# Patient Record
Sex: Male | Born: 1965 | Race: Black or African American | Hispanic: No | Marital: Married | Smoking: Former smoker
Health system: Southern US, Community
[De-identification: ages and names within clinical notes are randomized; demographics above are authoritative.]

## PROBLEM LIST (undated history)

## (undated) DIAGNOSIS — M1990 Unspecified osteoarthritis, unspecified site: Secondary | ICD-10-CM

## (undated) DIAGNOSIS — R519 Headache, unspecified: Secondary | ICD-10-CM

## (undated) DIAGNOSIS — M2550 Pain in unspecified joint: Secondary | ICD-10-CM

## (undated) DIAGNOSIS — I10 Essential (primary) hypertension: Secondary | ICD-10-CM

## (undated) DIAGNOSIS — R51 Headache: Secondary | ICD-10-CM

## (undated) DIAGNOSIS — Z8619 Personal history of other infectious and parasitic diseases: Secondary | ICD-10-CM

## (undated) HISTORY — PX: ANKLE SURGERY: SHX546

---

## 2001-05-05 ENCOUNTER — Encounter: Payer: Self-pay | Admitting: Family Medicine

## 2001-05-05 ENCOUNTER — Encounter: Admission: RE | Admit: 2001-05-05 | Discharge: 2001-05-05 | Payer: Self-pay | Admitting: Family Medicine

## 2002-12-31 ENCOUNTER — Encounter: Admission: RE | Admit: 2002-12-31 | Discharge: 2002-12-31 | Payer: Self-pay | Admitting: Family Medicine

## 2002-12-31 ENCOUNTER — Encounter: Payer: Self-pay | Admitting: Family Medicine

## 2004-12-27 ENCOUNTER — Ambulatory Visit (HOSPITAL_COMMUNITY): Admission: RE | Admit: 2004-12-27 | Discharge: 2004-12-27 | Payer: Self-pay | Admitting: Orthopedic Surgery

## 2006-05-02 ENCOUNTER — Encounter: Admission: RE | Admit: 2006-05-02 | Discharge: 2006-05-02 | Payer: Self-pay | Admitting: Family Medicine

## 2009-06-16 ENCOUNTER — Observation Stay (HOSPITAL_COMMUNITY): Admission: EM | Admit: 2009-06-16 | Discharge: 2009-06-16 | Payer: Self-pay | Admitting: Emergency Medicine

## 2009-07-10 DIAGNOSIS — R079 Chest pain, unspecified: Secondary | ICD-10-CM | POA: Insufficient documentation

## 2010-09-30 ENCOUNTER — Emergency Department (HOSPITAL_COMMUNITY)
Admission: EM | Admit: 2010-09-30 | Discharge: 2010-09-30 | Payer: Self-pay | Source: Home / Self Care | Admitting: Emergency Medicine

## 2010-10-02 ENCOUNTER — Encounter: Admission: RE | Admit: 2010-10-02 | Discharge: 2010-10-02 | Payer: Self-pay | Source: Home / Self Care

## 2011-01-04 LAB — DIFFERENTIAL
Basophils Absolute: 0 10*3/uL (ref 0.0–0.1)
Eosinophils Relative: 1 % (ref 0–5)
Lymphocytes Relative: 5 % — ABNORMAL LOW (ref 12–46)
Lymphs Abs: 0.4 10*3/uL — ABNORMAL LOW (ref 0.7–4.0)
Monocytes Absolute: 0.8 10*3/uL (ref 0.1–1.0)
Neutro Abs: 7.8 10*3/uL — ABNORMAL HIGH (ref 1.7–7.7)

## 2011-01-04 LAB — POCT CARDIAC MARKERS
CKMB, poc: 1.4 ng/mL (ref 1.0–8.0)
CKMB, poc: <1 ng/mL — ABNORMAL LOW (ref 1.0–8.0)
Troponin i, poc: <0.05 ng/mL (ref 0.00–0.09)

## 2011-01-04 LAB — BASIC METABOLIC PANEL
Calcium: 9.2 mg/dL (ref 8.4–10.5)
GFR calc Af Amer: >60 mL/min (ref >60)
GFR calc non Af Amer: >60 mL/min (ref >60)
Glucose, Bld: 99 mg/dL (ref 70–99)
Potassium: 4 mEq/L (ref 3.5–5.1)
Sodium: 139 mEq/L (ref 135–145)

## 2011-01-04 LAB — CK TOTAL AND CKMB (NOT AT ARMC)
CK, MB: 2.1 ng/mL (ref 0.3–4.0)
Relative Index: 1.1 (ref 0.0–2.5)
Relative Index: 1.2 (ref 0.0–2.5)

## 2011-01-04 LAB — TROPONIN I
Troponin I: 0.02 ng/mL (ref 0.00–0.06)
Troponin I: 0.03 ng/mL (ref 0.00–0.06)

## 2011-01-04 LAB — CBC
HCT: 40.8 % (ref 39.0–52.0)
Hemoglobin: 14.1 g/dL (ref 13.0–17.0)
RBC: 4.68 MIL/uL (ref 4.22–5.81)
RDW: 12.8 % (ref 11.5–15.5)
WBC: 9.1 10*3/uL (ref 4.0–10.5)

## 2011-01-04 LAB — HEPATIC FUNCTION PANEL
ALT: 23 U/L (ref 0–53)
Albumin: 3.5 g/dL (ref 3.5–5.2)
Alkaline Phosphatase: 66 U/L (ref 39–117)
Total Bilirubin: 0.7 mg/dL (ref 0.3–1.2)
Total Protein: 6.3 g/dL (ref 6.0–8.3)

## 2011-01-04 LAB — CARDIAC PANEL(CRET KIN+CKTOT+MB+TROPI): Total CK: 172 U/L (ref 7–232)

## 2012-02-15 IMAGING — CT CT MAXILLOFACIAL W/O CM
3 of 4 series · 16 of 37 positions shown, 18 images · non-contrast
Comparison: Cervical radiographs 05/02/2006.

CLINICAL DATA: 44-year-old male with right posterior mandible
lesion, cheek swelling.

CT MAXILLOFACIAL WITHOUT CONTRAST
TECHNIQUE: Multidetector CT imaging of the maxillofacial
structures was performed. Multiplanar CT image reconstructions were
also generated.

[Series 4: max bone · axial · 0.33mm/px · z∈[-85,+35]mm · 7 of 66 slices shown, 9 images]
[im 9/66  brain]
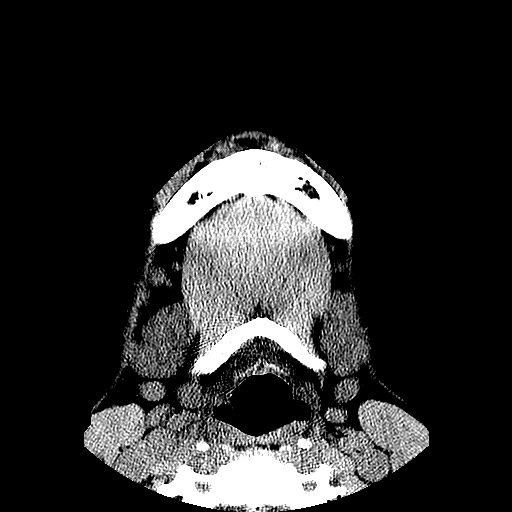
[im 9/66  bone]
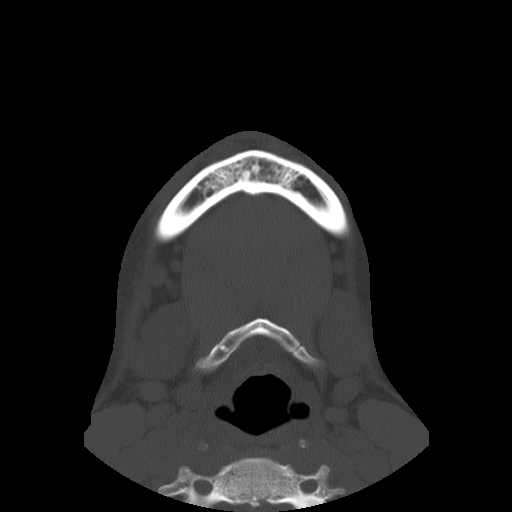
[im 17/66  bone]
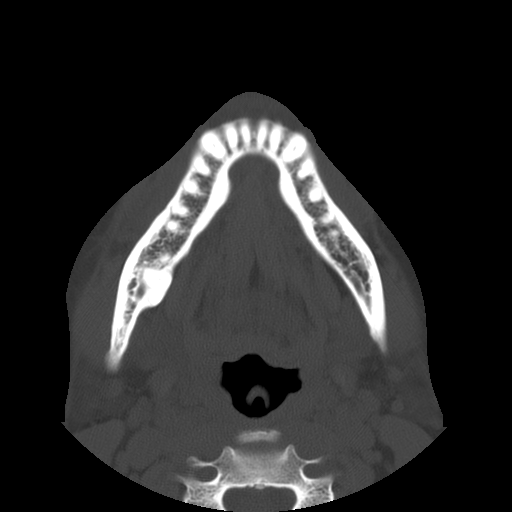
[im 25/66  bone]
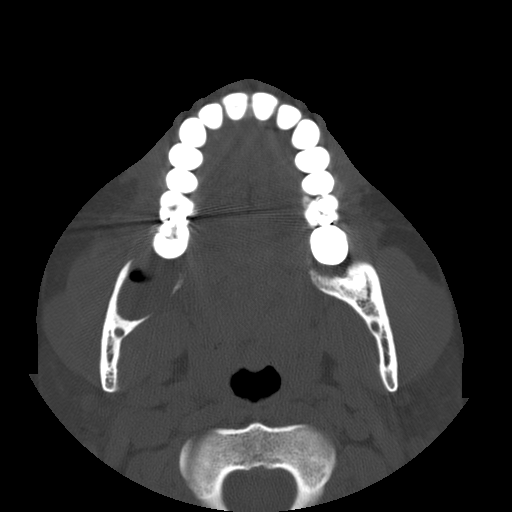
[im 33/66  bone]
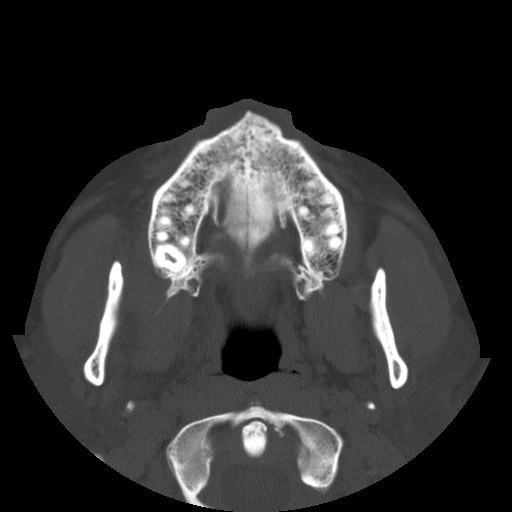
[im 41/66  brain]
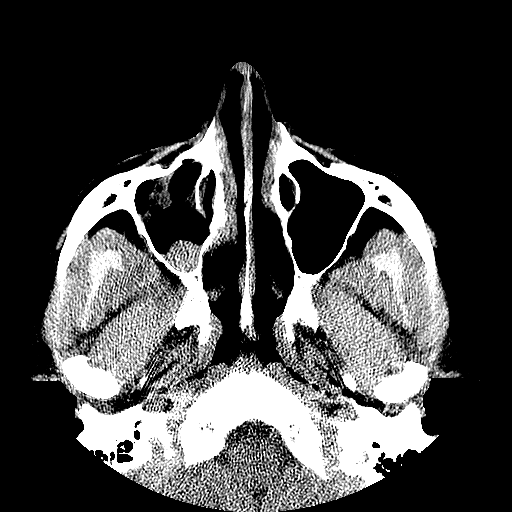
[im 41/66  bone]
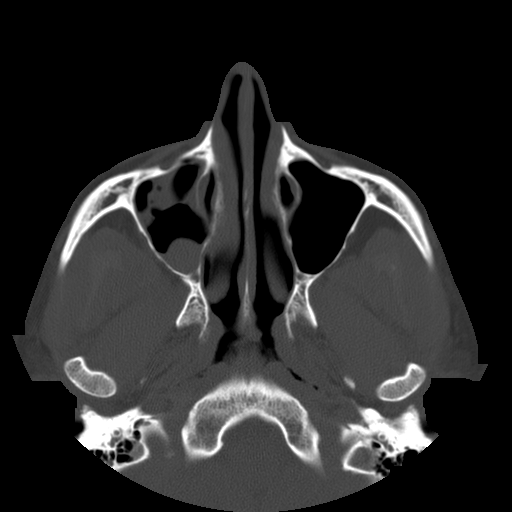
[im 49/66  bone]
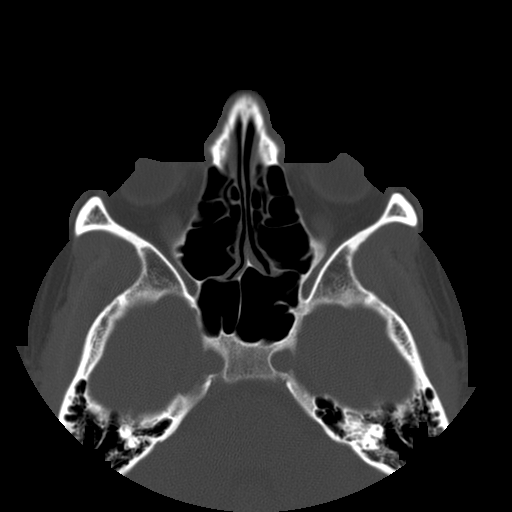
[im 57/66  bone]
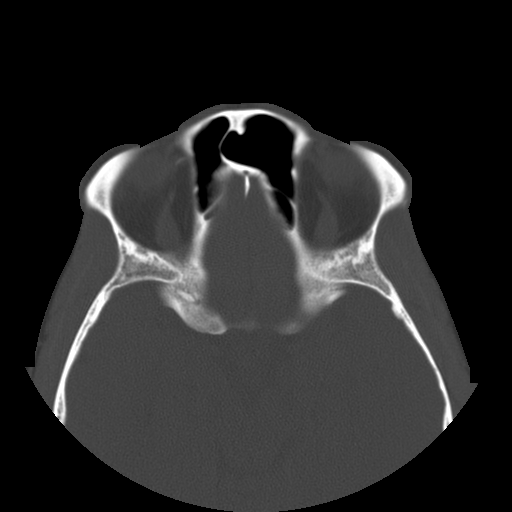

[Series 601: coronal facial · coronal · 0.33mm/px · 3 of 81 slices shown]
[im 27/81  bone]
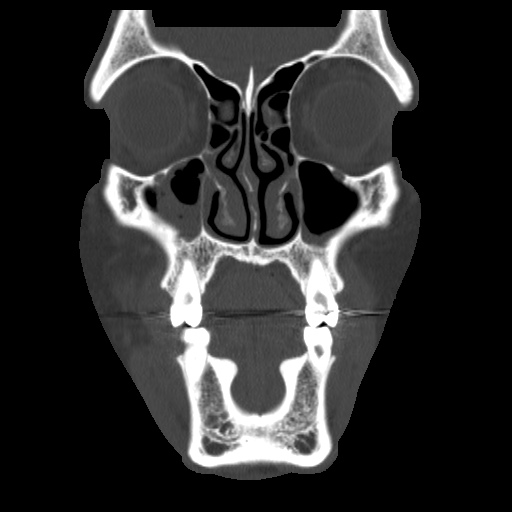
[im 41/81  bone]
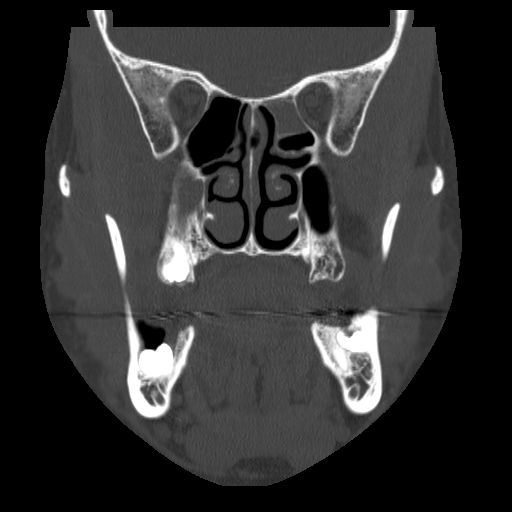
[im 54/81  bone]
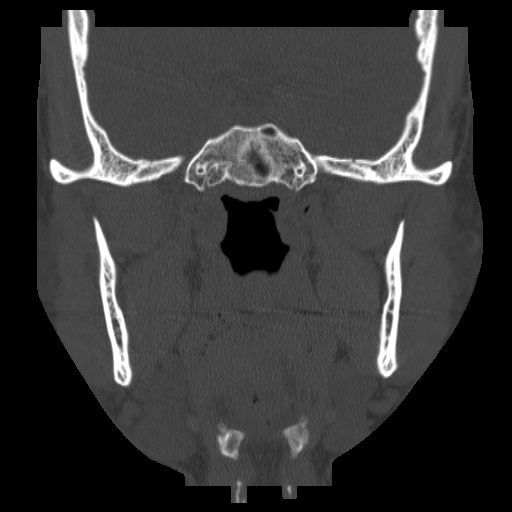

[Series 602: sagittal facial · sagittal · 0.33mm/px · 6 of 83 slices shown]
[im 8/83  bone]
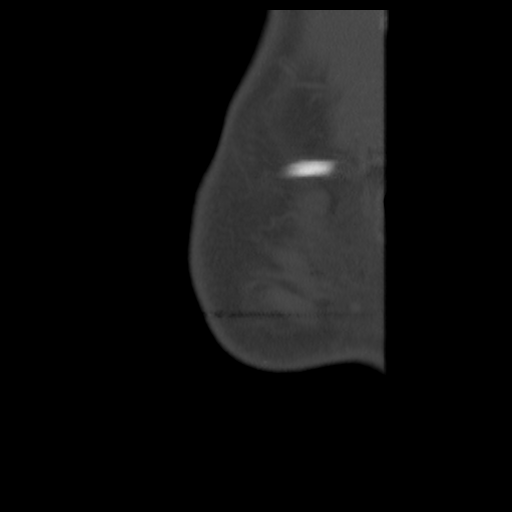
[im 15/83  bone]
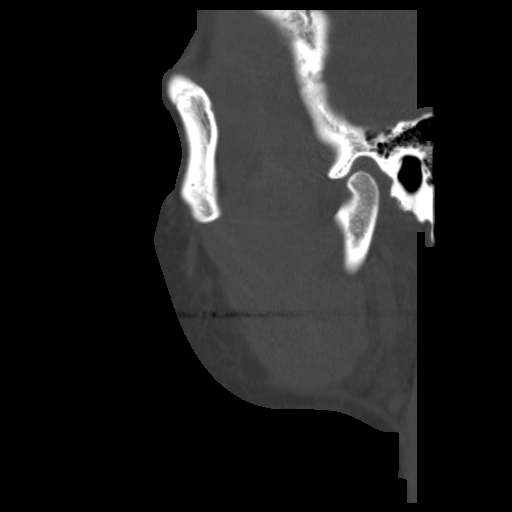
[im 30/83  bone]
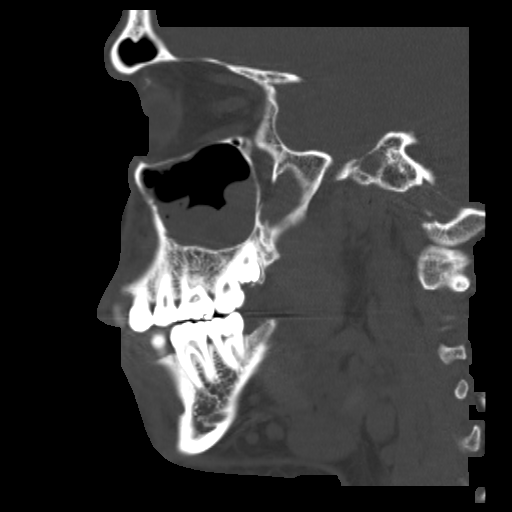
[im 38/83  bone]
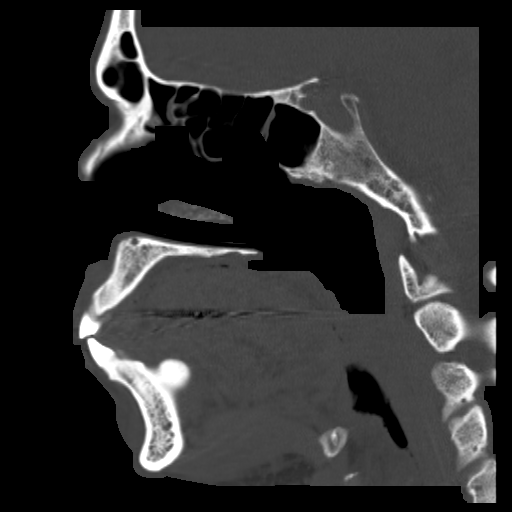
[im 45/83  bone]
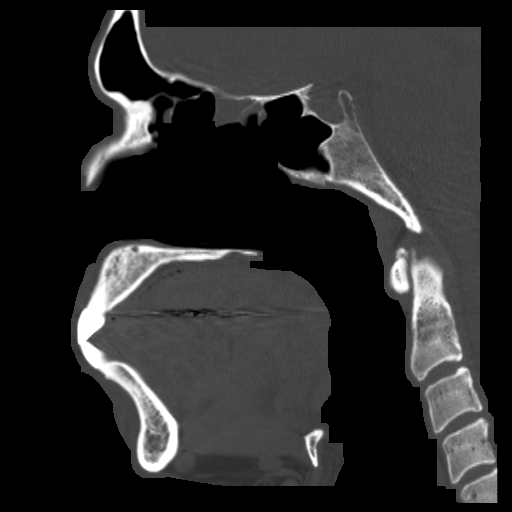
[im 53/83  bone]
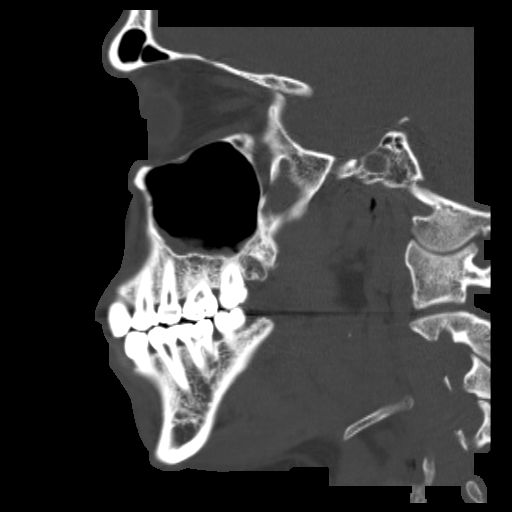

[16 of 37 positions shown; findings below may reference images not displayed]

FINDINGS: There is a large lucent lesion of the posterior right
mandible associated with the crown of a an unerupted wisdom tooth.
In retrospect, this is present in 7223, but today, contains an air-
fluid level which may be new.

The mandible elsewhere is within normal limits.  Small mandibular
torus are present bilaterally.  Dentition elsewhere within normal
limits; unerupted left mandibular and right maxillary wisdom teeth
also noted.

Mild inflammatory stranding in the subcutaneous fat about the right
angle of the mandible.  Sublingual space unaffected.
Parapharyngeal and retropharyngeal spaces appear unaffected.
Pharyngeal mucosal spaces within normal limits.  Submandibular and
visualized parotid glands within normal limits.  Right level I be
lymph nodes appear mildly increased in size and number, the largest
is an 11 mm short axis node on axial image 56.  Level II and other
visualized cervical lymph nodes appear more symmetric.

Pharyngeal mucosal spaces and visualized larynx are within normal
limits.

Mucosal thickening in the right maxillary sinus plus overlying
bubbly opacity.  Scattered ethmoid opacification, more so on the
left.  Sphenoid, frontal, and visualized mastoid air cells are
clear.

Visualized orbit soft tissues are within normal limits.  Visualized
noncontrast brain parenchyma within normal limits.
IMPRESSION: 1.  Constellation of findings suggestive of an infected preexisting
right mandibular odontogenic (favor dentigerous) cyst with
surrounding right perimandibular fat stranding and mild reactive
lymphadenopathy (superficial cellulitis).
2.  Right maxillary and ethmoid sinus inflammatory changes could
reflect acute sinusitis in the appropriate clinical setting.

I discussed the above findings with Dr. Jonnathan by telephone at [phone]
hours on 10/02/2010.

## 2016-06-07 ENCOUNTER — Other Ambulatory Visit: Payer: Self-pay | Admitting: Orthopedic Surgery

## 2016-06-21 ENCOUNTER — Encounter (HOSPITAL_COMMUNITY)
Admission: RE | Admit: 2016-06-21 | Discharge: 2016-06-21 | Disposition: A | Discharge status: Home / Self Care | Payer: BC Managed Care – PPO | Source: Ambulatory Visit | Attending: Orthopedic Surgery | Admitting: Orthopedic Surgery

## 2016-06-21 ENCOUNTER — Encounter (HOSPITAL_COMMUNITY): Payer: Self-pay

## 2016-06-21 DIAGNOSIS — M1611 Unilateral primary osteoarthritis, right hip: Secondary | ICD-10-CM | POA: Diagnosis not present

## 2016-06-21 DIAGNOSIS — Z01810 Encounter for preprocedural cardiovascular examination: Secondary | ICD-10-CM | POA: Diagnosis not present

## 2016-06-21 DIAGNOSIS — Z01812 Encounter for preprocedural laboratory examination: Secondary | ICD-10-CM | POA: Insufficient documentation

## 2016-06-21 HISTORY — DX: Personal history of other infectious and parasitic diseases: Z86.19

## 2016-06-21 HISTORY — DX: Headache: R51

## 2016-06-21 HISTORY — DX: Essential (primary) hypertension: I10

## 2016-06-21 HISTORY — DX: Pain in unspecified joint: M25.50

## 2016-06-21 HISTORY — DX: Headache, unspecified: R51.9

## 2016-06-21 HISTORY — DX: Unspecified osteoarthritis, unspecified site: M19.90

## 2016-06-21 LAB — CBC WITH DIFFERENTIAL/PLATELET
Basophils Absolute: 0 10*3/uL (ref 0.0–0.1)
Basophils Relative: 0 %
EOS ABS: 0.1 10*3/uL (ref 0.0–0.7)
Eosinophils Relative: 2 %
HEMATOCRIT: 42.5 % (ref 39.0–52.0)
HEMOGLOBIN: 14.8 g/dL (ref 13.0–17.0)
LYMPHS ABS: 1.5 10*3/uL (ref 0.7–4.0)
Lymphocytes Relative: 33 %
MCH: 29.2 pg (ref 26.0–34.0)
MCHC: 34.8 g/dL (ref 30.0–36.0)
MCV: 83.8 fL (ref 78.0–100.0)
MONOS PCT: 7 %
Monocytes Absolute: 0.3 10*3/uL (ref 0.1–1.0)
NEUTROS ABS: 2.7 10*3/uL (ref 1.7–7.7)
NEUTROS PCT: 58 %
Platelets: 256 10*3/uL (ref 150–400)
RBC: 5.07 MIL/uL (ref 4.22–5.81)
RDW: 12.6 % (ref 11.5–15.5)
WBC: 4.6 10*3/uL (ref 4.0–10.5)

## 2016-06-21 LAB — TYPE AND SCREEN
ABO/RH(D): AB POS
Antibody Screen: NEGATIVE

## 2016-06-21 LAB — COMPREHENSIVE METABOLIC PANEL
ALK PHOS: 67 U/L (ref 38–126)
ALT: 18 U/L (ref 17–63)
ANION GAP: 7 (ref 5–15)
AST: 24 U/L (ref 15–41)
Albumin: 3.9 g/dL (ref 3.5–5.0)
BILIRUBIN TOTAL: 0.7 mg/dL (ref 0.3–1.2)
BUN: 9 mg/dL (ref 6–20)
CALCIUM: 9 mg/dL (ref 8.9–10.3)
CO2: 24 mmol/L (ref 22–32)
Chloride: 107 mmol/L (ref 101–111)
Creatinine, Ser: 0.96 mg/dL (ref 0.61–1.24)
Glucose, Bld: 98 mg/dL (ref 65–99)
Potassium: 3.8 mmol/L (ref 3.5–5.1)
Sodium: 138 mmol/L (ref 135–145)
TOTAL PROTEIN: 7.3 g/dL (ref 6.5–8.1)

## 2016-06-21 LAB — URINALYSIS, ROUTINE W REFLEX MICROSCOPIC
Bilirubin Urine: NEGATIVE
GLUCOSE, UA: NEGATIVE mg/dL
HGB URINE DIPSTICK: NEGATIVE
Ketones, ur: NEGATIVE mg/dL
Leukocytes, UA: NEGATIVE
Nitrite: NEGATIVE
Protein, ur: NEGATIVE mg/dL
SPECIFIC GRAVITY, URINE: 1.017 (ref 1.005–1.030)
pH: 6 (ref 5.0–8.0)

## 2016-06-21 LAB — SURGICAL PCR SCREEN
MRSA, PCR: NEGATIVE
Staphylococcus aureus: NEGATIVE

## 2016-06-21 LAB — PROTIME-INR
INR: 1.13
Prothrombin Time: 14.5 seconds (ref 11.4–15.2)

## 2016-06-21 LAB — ABO/RH: ABO/RH(D): AB POS

## 2016-06-21 LAB — APTT: aPTT: 33 seconds (ref 24–36)

## 2016-06-21 MED ORDER — CHLORHEXIDINE GLUCONATE 4 % EX LIQD: 60.0000 mL | Freq: Once | CUTANEOUS | Status: DC

## 2016-06-21 NOTE — Progress Notes (Signed)
Pt states no TB but always has to have CXR bc they can't to the 'TB skin test'

## 2016-06-21 NOTE — Progress Notes (Addendum)
Cardiologist denies  Denies echo/stress test/heart cath  Sees NP [place] Scrapeakela Anderson for Primary Needs  EKG denies in past yr  CXR requesting from T Dareen PianoAnderson

## 2016-06-21 NOTE — Pre-Procedure Instructions (Signed)
[name] Stone  06/21/2016     No Pharmacies Listed   Your procedure is scheduled on Fri, Sept 29 @ 9:45 AM  Report to [facility] Admitting at 7:45 AM  Call this number if you have problems the morning of surgery:  [phone]   Remember:  Do not eat food or drink liquids after midnight.             No Goody's,BC's,Aleve,Advil,Motrin,Ibuprofen, Aspirin,Fish Oil,or any Herbal Medications.    Do not wear jewelry.  Do not wear lotions, powders,colognes, or deoderant.   Men may shave face and neck.  Do not bring valuables to the hospital.  [facility] is not responsible for any belongings or valuables.  Contacts, dentures or bridgework may not be worn into surgery.  Leave your suitcase in the car.  After surgery it may be brought to your room.  For patients admitted to the hospital, discharge time will be determined by your treatment team.  Patients discharged the day of surgery will not be allowed to drive home.    [facility]  Before surgery, you can play an important role.  Because skin is not sterile, your skin needs to be as free of germs as possible.  You can reduce the number of germs on you skin by washing with CHG (chlorahexidine gluconate) soap before surgery.  CHG is an antiseptic cleaner which kills germs and bonds with the skin to continue killing germs even after washing.  Please DO NOT use if you have an allergy to CHG or antibacterial soaps.  If your skin becomes reddened/irritated stop using the CHG and inform your nurse when you arrive at Short Stay.  Do not shave (including legs and underarms) for at least 48 hours prior to the first CHG shower.  You may shave your face.  Please follow these instructions carefully:   1.  Shower with CHG Soap the night before surgery and the                                morning of Surgery.  2.  If you choose to wash your hair, wash your hair first as usual with your        normal shampoo.  3.  After you shampoo, rinse your hair and body thoroughly to remove the                      Shampoo.  4.  Use CHG as you would any other liquid soap.  You can apply chg directly       to the skin and wash gently with scrungie or a clean washcloth.  5.  Apply the CHG Soap to your body ONLY FROM THE NECK DOWN.        Do not use on open wounds or open sores.  Avoid contact with your eyes,       ears, mouth and genitals (private parts).  Wash genitals (private parts)       with your normal soap.  6.  Wash thoroughly, paying special attention to the area where your surgery        will be performed.  7.  Thoroughly rinse your body with warm water from the neck down.  8.  DO NOT shower/wash with your normal soap after using and rinsing off       the CHG  Soap.  9.  Pat yourself dry with a clean towel.            10.  Wear clean pajamas.            11.  Place clean sheets on your bed the night of your first shower and do not        sleep with pets.  Day of Surgery  Do not apply any lotions/deoderants the morning of surgery.  Please wear clean clothes to the hospital/surgery center.    Please read over the following fact sheets that you were given. Pain Booklet, MRSA Information and Surgical Site Infection Prevention

## 2016-06-28 ENCOUNTER — Encounter (HOSPITAL_COMMUNITY)
Admission: RE | Disposition: A | Discharge status: Home Health | Payer: Self-pay | Source: Ambulatory Visit | Attending: Orthopedic Surgery

## 2016-06-28 ENCOUNTER — Inpatient Hospital Stay (HOSPITAL_COMMUNITY)
Admission: RE | Admit: 2016-06-28 | Discharge: 2016-06-29 | DRG: 470 | Disposition: A | Discharge status: Home Health | Payer: BC Managed Care – PPO | Source: Ambulatory Visit | Attending: Orthopedic Surgery | Admitting: Orthopedic Surgery

## 2016-06-28 ENCOUNTER — Inpatient Hospital Stay (HOSPITAL_COMMUNITY): Payer: BC Managed Care – PPO | Admitting: Certified Registered"

## 2016-06-28 ENCOUNTER — Inpatient Hospital Stay (HOSPITAL_COMMUNITY): Payer: BC Managed Care – PPO

## 2016-06-28 ENCOUNTER — Encounter (HOSPITAL_COMMUNITY): Payer: Self-pay | Admitting: Certified Registered"

## 2016-06-28 DIAGNOSIS — F17290 Nicotine dependence, other tobacco product, uncomplicated: Secondary | ICD-10-CM | POA: Diagnosis present

## 2016-06-28 DIAGNOSIS — M1611 Unilateral primary osteoarthritis, right hip: Principal | ICD-10-CM | POA: Diagnosis present

## 2016-06-28 DIAGNOSIS — I10 Essential (primary) hypertension: Secondary | ICD-10-CM | POA: Diagnosis present

## 2016-06-28 DIAGNOSIS — Z79899 Other long term (current) drug therapy: Secondary | ICD-10-CM

## 2016-06-28 DIAGNOSIS — Z6831 Body mass index (BMI) 31.0-31.9, adult: Secondary | ICD-10-CM

## 2016-06-28 DIAGNOSIS — Z01818 Encounter for other preprocedural examination: Secondary | ICD-10-CM

## 2016-06-28 DIAGNOSIS — M25551 Pain in right hip: Secondary | ICD-10-CM | POA: Diagnosis present

## 2016-06-28 DIAGNOSIS — Z419 Encounter for procedure for purposes other than remedying health state, unspecified: Secondary | ICD-10-CM

## 2016-06-28 HISTORY — PX: TOTAL HIP ARTHROPLASTY: SHX124

## 2016-06-28 SURGERY — ARTHROPLASTY, HIP, TOTAL, ANTERIOR APPROACH
Anesthesia: Spinal | Site: Hip | Laterality: Right

## 2016-06-28 MED ORDER — MIDAZOLAM HCL 2 MG/2ML IJ SOLN
INTRAMUSCULAR | Status: AC
  Filled 2016-06-28: qty 2

## 2016-06-28 MED ORDER — MIDAZOLAM HCL 2 MG/2ML IJ SOLN: 0.5000 mg | Freq: Once | INTRAMUSCULAR | Status: DC | PRN

## 2016-06-28 MED ORDER — TRANEXAMIC ACID 1000 MG/10ML IV SOLN: 1000.0000 mg | INTRAVENOUS | Status: DC

## 2016-06-28 MED ORDER — PROPOFOL 500 MG/50ML IV EMUL
INTRAVENOUS | Status: DC | PRN
  Administered 2016-06-28: 75 ug/kg/min via INTRAVENOUS

## 2016-06-28 MED ORDER — TRANEXAMIC ACID 1000 MG/10ML IV SOLN
1000.0000 mg | INTRAVENOUS | Status: AC
  Administered 2016-06-28: 1000 mg via INTRAVENOUS
  Filled 2016-06-28: qty 10

## 2016-06-28 MED ORDER — MEPERIDINE HCL 25 MG/ML IJ SOLN: 6.2500 mg | INTRAMUSCULAR | Status: DC | PRN

## 2016-06-28 MED ORDER — SUFENTANIL CITRATE 50 MCG/ML IV SOLN
INTRAVENOUS | Status: AC
  Filled 2016-06-28: qty 1

## 2016-06-28 MED ORDER — BISACODYL 5 MG PO TBEC: 5.0000 mg | DELAYED_RELEASE_TABLET | Freq: Every day | ORAL | Status: DC | PRN

## 2016-06-28 MED ORDER — METHOCARBAMOL 500 MG PO TABS
500.0000 mg | ORAL_TABLET | Freq: Four times a day (QID) | ORAL | Status: DC | PRN
Start: 2016-06-28 — End: 2016-06-29
  Administered 2016-06-28 – 2016-06-29 (×3): 500 mg via ORAL
  Filled 2016-06-28 (×3): qty 1

## 2016-06-28 MED ORDER — ONDANSETRON HCL 4 MG PO TABS: 4.0000 mg | ORAL_TABLET | Freq: Four times a day (QID) | ORAL | Status: DC | PRN

## 2016-06-28 MED ORDER — ONDANSETRON HCL 4 MG/2ML IJ SOLN
4.0000 mg | Freq: Four times a day (QID) | INTRAMUSCULAR | Status: DC | PRN
  Administered 2016-06-28: 4 mg via INTRAVENOUS
  Filled 2016-06-28: qty 2

## 2016-06-28 MED ORDER — ACETAMINOPHEN 325 MG PO TABS
650.0000 mg | ORAL_TABLET | Freq: Four times a day (QID) | ORAL | Status: DC | PRN
  Administered 2016-06-29: 650 mg via ORAL
  Filled 2016-06-28: qty 2

## 2016-06-28 MED ORDER — MIDAZOLAM HCL 5 MG/5ML IJ SOLN
INTRAMUSCULAR | Status: DC | PRN
  Administered 2016-06-28: 2 mg via INTRAVENOUS

## 2016-06-28 MED ORDER — LIDOCAINE 2% (20 MG/ML) 5 ML SYRINGE
INTRAMUSCULAR | Status: AC
  Filled 2016-06-28: qty 5

## 2016-06-28 MED ORDER — PROMETHAZINE HCL 25 MG/ML IJ SOLN: 6.2500 mg | INTRAMUSCULAR | Status: DC | PRN

## 2016-06-28 MED ORDER — PHENYLEPHRINE 40 MCG/ML (10ML) SYRINGE FOR IV PUSH (FOR BLOOD PRESSURE SUPPORT)
PREFILLED_SYRINGE | INTRAVENOUS | Status: AC
  Filled 2016-06-28: qty 10

## 2016-06-28 MED ORDER — CEFAZOLIN SODIUM-DEXTROSE 2-4 GM/100ML-% IV SOLN
2.0000 g | INTRAVENOUS | Status: AC
  Administered 2016-06-28: 2 g via INTRAVENOUS
  Filled 2016-06-28: qty 100

## 2016-06-28 MED ORDER — KETOROLAC TROMETHAMINE 15 MG/ML IJ SOLN
15.0000 mg | Freq: Three times a day (TID) | INTRAMUSCULAR | Status: DC
  Administered 2016-06-28 – 2016-06-29 (×2): 15 mg via INTRAVENOUS
  Filled 2016-06-28 (×2): qty 1

## 2016-06-28 MED ORDER — MAGNESIUM CITRATE PO SOLN: 1.0000 | Freq: Once | ORAL | Status: DC | PRN

## 2016-06-28 MED ORDER — METHOCARBAMOL 750 MG PO TABS: 750.0000 mg | ORAL_TABLET | Freq: Three times a day (TID) | ORAL | 0 refills | Status: DC | PRN

## 2016-06-28 MED ORDER — BUPIVACAINE HCL (PF) 0.5 % IJ SOLN
INTRAMUSCULAR | Status: DC | PRN
  Administered 2016-06-28: 20 mg via INTRATHECAL

## 2016-06-28 MED ORDER — GABAPENTIN 300 MG PO CAPS
300.0000 mg | ORAL_CAPSULE | Freq: Two times a day (BID) | ORAL | Status: DC
  Administered 2016-06-28 – 2016-06-29 (×2): 300 mg via ORAL
  Filled 2016-06-28 (×2): qty 1

## 2016-06-28 MED ORDER — DEXAMETHASONE SODIUM PHOSPHATE 10 MG/ML IJ SOLN
10.0000 mg | Freq: Two times a day (BID) | INTRAMUSCULAR | Status: AC
  Administered 2016-06-28 – 2016-06-29 (×2): 10 mg via INTRAVENOUS
  Filled 2016-06-28 (×2): qty 1

## 2016-06-28 MED ORDER — DOCUSATE SODIUM 100 MG PO CAPS
100.0000 mg | ORAL_CAPSULE | Freq: Two times a day (BID) | ORAL | Status: DC
  Administered 2016-06-28 – 2016-06-29 (×2): 100 mg via ORAL
  Filled 2016-06-28 (×2): qty 1

## 2016-06-28 MED ORDER — SODIUM CHLORIDE 0.9 % IV SOLN
INTRAVENOUS | Status: DC
  Administered 2016-06-28: 23:00:00 via INTRAVENOUS

## 2016-06-28 MED ORDER — ACETAMINOPHEN 650 MG RE SUPP: 650.0000 mg | Freq: Four times a day (QID) | RECTAL | Status: DC | PRN

## 2016-06-28 MED ORDER — POLYETHYLENE GLYCOL 3350 17 G PO PACK: 17.0000 g | PACK | Freq: Every day | ORAL | Status: DC | PRN

## 2016-06-28 MED ORDER — ASPIRIN EC 325 MG PO TBEC
325.0000 mg | DELAYED_RELEASE_TABLET | Freq: Two times a day (BID) | ORAL | Status: DC
  Administered 2016-06-28 – 2016-06-29 (×3): 325 mg via ORAL
  Filled 2016-06-28 (×3): qty 1

## 2016-06-28 MED ORDER — HYDROMORPHONE HCL 1 MG/ML IJ SOLN: 0.2500 mg | INTRAMUSCULAR | Status: DC | PRN

## 2016-06-28 MED ORDER — ALUM & MAG HYDROXIDE-SIMETH 200-200-20 MG/5ML PO SUSP: 30.0000 mL | ORAL | Status: DC | PRN

## 2016-06-28 MED ORDER — LACTATED RINGERS IV SOLN
INTRAVENOUS | Status: DC
  Administered 2016-06-28 (×2): via INTRAVENOUS

## 2016-06-28 MED ORDER — PHENYLEPHRINE 40 MCG/ML (10ML) SYRINGE FOR IV PUSH (FOR BLOOD PRESSURE SUPPORT)
PREFILLED_SYRINGE | INTRAVENOUS | Status: DC | PRN
  Administered 2016-06-28 (×2): 40 ug via INTRAVENOUS
  Administered 2016-06-28 (×2): 80 ug via INTRAVENOUS
  Administered 2016-06-28: 40 ug via INTRAVENOUS
  Administered 2016-06-28: 80 ug via INTRAVENOUS
  Administered 2016-06-28: 40 ug via INTRAVENOUS

## 2016-06-28 MED ORDER — EPHEDRINE SULFATE 50 MG/ML IJ SOLN
INTRAMUSCULAR | Status: DC | PRN
  Administered 2016-06-28: 10 mg via INTRAVENOUS
  Administered 2016-06-28: 5 mg via INTRAVENOUS
  Administered 2016-06-28: 10 mg via INTRAVENOUS

## 2016-06-28 MED ORDER — LOSARTAN POTASSIUM 50 MG PO TABS
50.0000 mg | ORAL_TABLET | Freq: Every day | ORAL | Status: DC
  Administered 2016-06-28 – 2016-06-29 (×2): 50 mg via ORAL
  Filled 2016-06-28 (×2): qty 1

## 2016-06-28 MED ORDER — SODIUM CHLORIDE 0.9 % IJ SOLN
INTRAMUSCULAR | Status: AC
  Filled 2016-06-28: qty 10

## 2016-06-28 MED ORDER — 0.9 % SODIUM CHLORIDE (POUR BTL) OPTIME
TOPICAL | Status: DC | PRN
  Administered 2016-06-28: 1000 mL

## 2016-06-28 MED ORDER — OXYCODONE-ACETAMINOPHEN 5-325 MG PO TABS: 1.0000 | ORAL_TABLET | ORAL | 0 refills | Status: DC | PRN

## 2016-06-28 MED ORDER — ASPIRIN EC 325 MG PO TBEC: 325.0000 mg | DELAYED_RELEASE_TABLET | Freq: Two times a day (BID) | ORAL | 0 refills | Status: DC

## 2016-06-28 MED ORDER — ZOLPIDEM TARTRATE 5 MG PO TABS: 5.0000 mg | ORAL_TABLET | Freq: Every evening | ORAL | Status: DC | PRN

## 2016-06-28 MED ORDER — METHOCARBAMOL 1000 MG/10ML IJ SOLN
500.0000 mg | Freq: Four times a day (QID) | INTRAVENOUS | Status: DC | PRN
  Filled 2016-06-28: qty 5

## 2016-06-28 MED ORDER — BUPIVACAINE HCL 0.5 % IJ SOLN
INTRAMUSCULAR | Status: DC | PRN
  Administered 2016-06-28: 20 mL

## 2016-06-28 MED ORDER — BUPIVACAINE LIPOSOME 1.3 % IJ SUSP
20.0000 mL | INTRAMUSCULAR | Status: AC
  Administered 2016-06-28: 20 mL
  Filled 2016-06-28: qty 20

## 2016-06-28 MED ORDER — TRANEXAMIC ACID 1000 MG/10ML IV SOLN
1000.0000 mg | Freq: Once | INTRAVENOUS | Status: AC
  Administered 2016-06-28: 1000 mg via INTRAVENOUS
  Filled 2016-06-28: qty 10

## 2016-06-28 MED ORDER — PROPOFOL 10 MG/ML IV BOLUS
INTRAVENOUS | Status: DC | PRN
  Administered 2016-06-28: 20 mg via INTRAVENOUS

## 2016-06-28 MED ORDER — BUPIVACAINE HCL (PF) 0.5 % IJ SOLN
INTRAMUSCULAR | Status: AC
  Filled 2016-06-28: qty 30

## 2016-06-28 MED ORDER — OXYCODONE HCL 5 MG PO TABS
5.0000 mg | ORAL_TABLET | ORAL | Status: DC | PRN
  Administered 2016-06-28 – 2016-06-29 (×4): 10 mg via ORAL
  Filled 2016-06-28 (×4): qty 2

## 2016-06-28 MED ORDER — CEFAZOLIN SODIUM-DEXTROSE 2-4 GM/100ML-% IV SOLN
2.0000 g | Freq: Four times a day (QID) | INTRAVENOUS | Status: AC
  Administered 2016-06-28 (×2): 2 g via INTRAVENOUS
  Filled 2016-06-28 (×2): qty 100

## 2016-06-28 MED ORDER — ROCURONIUM BROMIDE 10 MG/ML (PF) SYRINGE
PREFILLED_SYRINGE | INTRAVENOUS | Status: AC
  Filled 2016-06-28: qty 10

## 2016-06-28 MED ORDER — HYDROMORPHONE HCL 1 MG/ML IJ SOLN
1.0000 mg | INTRAMUSCULAR | Status: DC | PRN
  Administered 2016-06-28 (×2): 1 mg via INTRAVENOUS
  Filled 2016-06-28 (×2): qty 1
  Filled 2016-06-28: qty 2

## 2016-06-28 MED ORDER — PROPOFOL 10 MG/ML IV BOLUS
INTRAVENOUS | Status: AC
  Filled 2016-06-28: qty 20

## 2016-06-28 MED ORDER — DIPHENHYDRAMINE HCL 12.5 MG/5ML PO ELIX: 12.5000 mg | ORAL_SOLUTION | ORAL | Status: DC | PRN

## 2016-06-28 MED ORDER — EPHEDRINE 5 MG/ML INJ
INTRAVENOUS | Status: AC
  Filled 2016-06-28: qty 10

## 2016-06-28 SURGICAL SUPPLY — 63 items
APL SKNCLS STERI-STRIP NONHPOA (GAUZE/BANDAGES/DRESSINGS) ×1
BENZOIN TINCTURE PRP APPL 2/3 (GAUZE/BANDAGES/DRESSINGS) ×3 IMPLANT
BLADE SAW SGTL 18X1.27X75 (BLADE) ×2 IMPLANT
BLADE SAW SGTL 18X1.27X75MM (BLADE) ×1
BLADE SURG ROTATE 9660 (MISCELLANEOUS) IMPLANT
BNDG COHESIVE 6X5 TAN STRL LF (GAUZE/BANDAGES/DRESSINGS) IMPLANT
BNDG GAUZE ELAST 4 BULKY (GAUZE/BANDAGES/DRESSINGS) IMPLANT
CAPT HIP TOTAL 2 ×2 IMPLANT
CELLS DAT CNTRL 66122 CELL SVR (MISCELLANEOUS) IMPLANT
CLOSURE STERI-STRIP 1/2X4 (GAUZE/BANDAGES/DRESSINGS) ×1
CLSR STERI-STRIP ANTIMIC 1/2X4 (GAUZE/BANDAGES/DRESSINGS) ×1 IMPLANT
COVER PERINEAL POST (MISCELLANEOUS) ×3 IMPLANT
COVER SURGICAL LIGHT HANDLE (MISCELLANEOUS) ×3 IMPLANT
DRAPE C-ARM 42X72 X-RAY (DRAPES) ×3 IMPLANT
DRAPE STERI IOBAN 125X83 (DRAPES) ×3 IMPLANT
DRAPE U-SHAPE 47X51 STRL (DRAPES) ×6 IMPLANT
DRESSING AQUACEL AG SP 3.5X10 (GAUZE/BANDAGES/DRESSINGS) IMPLANT
DRSG AQUACEL AG ADV 3.5X10 (GAUZE/BANDAGES/DRESSINGS) ×3 IMPLANT
DRSG AQUACEL AG SP 3.5X10 (GAUZE/BANDAGES/DRESSINGS) ×3
DURAPREP 26ML APPLICATOR (WOUND CARE) ×3 IMPLANT
ELECT BLADE 4.0 EZ CLEAN MEGAD (MISCELLANEOUS)
ELECT CAUTERY BLADE 6.4 (BLADE) ×3 IMPLANT
ELECT REM PT RETURN 9FT ADLT (ELECTROSURGICAL) ×3
ELECTRODE BLDE 4.0 EZ CLN MEGD (MISCELLANEOUS) IMPLANT
ELECTRODE REM PT RTRN 9FT ADLT (ELECTROSURGICAL) ×1 IMPLANT
GAUZE XEROFORM 1X8 LF (GAUZE/BANDAGES/DRESSINGS) ×3 IMPLANT
GLOVE BIOGEL PI IND STRL 8 (GLOVE) ×2 IMPLANT
GLOVE BIOGEL PI INDICATOR 8 (GLOVE) ×4
GLOVE ECLIPSE 7.5 STRL STRAW (GLOVE) ×6 IMPLANT
GOWN STRL REUS W/ TWL LRG LVL3 (GOWN DISPOSABLE) ×2 IMPLANT
GOWN STRL REUS W/ TWL XL LVL3 (GOWN DISPOSABLE) ×2 IMPLANT
GOWN STRL REUS W/TWL LRG LVL3 (GOWN DISPOSABLE) ×6
GOWN STRL REUS W/TWL XL LVL3 (GOWN DISPOSABLE) ×6
HOOD PEEL AWAY FACE SHEILD DIS (HOOD) ×6 IMPLANT
KIT BASIN OR (CUSTOM PROCEDURE TRAY) ×3 IMPLANT
KIT ROOM TURNOVER OR (KITS) ×3 IMPLANT
MANIFOLD NEPTUNE II (INSTRUMENTS) ×3 IMPLANT
NDL SPNL 22GX3.5 QUINCKE BK (NEEDLE) ×1 IMPLANT
NEEDLE SPNL 22GX3.5 QUINCKE BK (NEEDLE) ×3 IMPLANT
NS IRRIG 1000ML POUR BTL (IV SOLUTION) ×3 IMPLANT
PACK TOTAL JOINT (CUSTOM PROCEDURE TRAY) ×3 IMPLANT
PACK UNIVERSAL I (CUSTOM PROCEDURE TRAY) ×3 IMPLANT
PAD ARMBOARD 7.5X6 YLW CONV (MISCELLANEOUS) ×6 IMPLANT
RETRACTOR WND ALEXIS 18 MED (MISCELLANEOUS) IMPLANT
RTRCTR WOUND ALEXIS 18CM MED (MISCELLANEOUS)
RTRCTR WOUND ALEXIS 18CM SML (INSTRUMENTS) ×3
SAVER CELL AAL HAEMONETICS (INSTRUMENTS) ×1 IMPLANT
SPONGE LAP 18X18 X RAY DECT (DISPOSABLE) IMPLANT
STAPLER VISISTAT 35W (STAPLE) IMPLANT
SUT ETHIBOND NAB CT1 #1 30IN (SUTURE) ×6 IMPLANT
SUT MNCRL AB 3-0 PS2 18 (SUTURE) IMPLANT
SUT VIC AB 0 CT1 27 (SUTURE) ×3
SUT VIC AB 0 CT1 27XBRD ANBCTR (SUTURE) ×1 IMPLANT
SUT VIC AB 1 CT1 27 (SUTURE) ×6
SUT VIC AB 1 CT1 27XBRD ANBCTR (SUTURE) ×2 IMPLANT
SUT VIC AB 2-0 CT1 27 (SUTURE) ×3
SUT VIC AB 2-0 CT1 TAPERPNT 27 (SUTURE) ×1 IMPLANT
SYR 50ML LL SCALE MARK (SYRINGE) ×3 IMPLANT
TOWEL OR 17X24 6PK STRL BLUE (TOWEL DISPOSABLE) ×3 IMPLANT
TOWEL OR 17X26 10 PK STRL BLUE (TOWEL DISPOSABLE) ×3 IMPLANT
TRAY CATH 16FR W/PLASTIC CATH (SET/KITS/TRAYS/PACK) ×2 IMPLANT
TRAY FOLEY CATH 16FR SILVER (SET/KITS/TRAYS/PACK) IMPLANT
WATER STERILE IRR 1000ML POUR (IV SOLUTION) ×6 IMPLANT

## 2016-06-28 NOTE — H&P (Signed)
TOTAL HIP ADMISSION H&P  Patient is admitted for right total hip arthroplasty.  Subjective:  Chief Complaint: right hip pain  HPI: [name] Stone, 50 y.o. male, has a history of pain and functional disability in the right hip(s) due to arthritis and patient has failed non-surgical conservative treatments for greater than 12 weeks to include NSAID's and/or analgesics, corticosteriod injections and activity modification.  Onset of symptoms was gradual starting 3 years ago with gradually worsening course since that time.The patient noted no past surgery on the right hip(s).  Patient currently rates pain in the right hip at 9 out of 10 with activity. Patient has night pain, worsening of pain with activity and weight bearing, trendelenberg gait, crepitus and joint swelling. Patient has evidence of subchondral cysts, subchondral sclerosis, periarticular osteophytes and joint space narrowing by imaging studies. This condition presents safety issues increasing the risk of falls. This patient has had failure of all reasonable conservative care.  There is no current active infection.  Patient Active Problem List   Diagnosis Date Noted  . CHEST PAIN 07/10/2009   Past Medical History:  Diagnosis Date  . Arthritis   . Headache    takes Excedrin Migraine as needed  . History of shingles   . Hypertension    takes Losartan daily  . Joint pain     Past Surgical History:  Procedure Laterality Date  . ANKLE SURGERY Right 90's   with plates and screws     Prescriptions Prior to Admission  Medication Sig Dispense Refill Last Dose  . aspirin-acetaminophen-caffeine (EXCEDRIN MIGRAINE) 250-250-65 MG tablet Take 2 tablets by mouth every 6 (six) hours as needed for headache.     . losartan (COZAAR) 50 MG tablet Take 50 mg by mouth daily.      No Known Allergies  Social History  Substance Use Topics  . Smoking status: Current Some Day Smoker    Types: Cigars  . Smokeless tobacco: Never Used     Comment:  once every 6 months  . Alcohol use Yes     Comment: rarely    History reviewed. No pertinent family history.   ROS ROS: I have reviewed the patient's review of systems thoroughly and there are no positive responses as relates to the HPI. Objective:  Physical Exam  Vital signs in last 24 hours: Temp:  [99 F (37.2 C)] 99 F (37.2 C) (09/29 0818) Pulse Rate:  [72] 72 (09/29 0818) Resp:  [20] 20 (09/29 0818) BP: (157)/(101) 157/101 (09/29 0818) SpO2:  [100 %] 100 % (09/29 0818) Weight:  [105.4 kg (232 lb 7 oz)] 105.4 kg (232 lb 7 oz) (09/29 0818) Well-developed well-nourished patient in no acute distress. Alert and oriented x3 HEENT:within normal limits Cardiac: Regular rate and rhythm Pulmonary: Lungs clear to auscultation Abdomen: Soft and nontender.  Normal active bowel sounds  Musculoskeletal: (r hip: painful rom limited rom - instability nvi distally Labs:  Recent Results (from the past 2160 hour(s))  Surgical pcr screen     Status: None   Collection Time: 06/21/16 10:37 AM  Result Value Ref Range   MRSA, PCR NEGATIVE NEGATIVE   Staphylococcus aureus NEGATIVE NEGATIVE    Comment:        The Xpert SA Assay (FDA approved for NASAL specimens in patients over 28 years of age), is one component of a comprehensive surveillance program.  Test performance has been validated by [facility] for patients greater than or equal to 80 year old. It is not  intended to diagnose infection nor to guide or monitor treatment.   Urinalysis, Routine w reflex microscopic (not at [facility])     Status: None   Collection Time: 06/21/16 10:49 AM  Result Value Ref Range   Color, Urine YELLOW YELLOW   APPearance CLEAR CLEAR   Specific Gravity, Urine 1.017 1.005 - 1.030   pH 6.0 5.0 - 8.0   Glucose, UA NEGATIVE NEGATIVE mg/dL   Hgb urine dipstick NEGATIVE NEGATIVE   Bilirubin Urine NEGATIVE NEGATIVE   Ketones, ur NEGATIVE NEGATIVE mg/dL   Protein, ur NEGATIVE NEGATIVE mg/dL   Nitrite  NEGATIVE NEGATIVE   Leukocytes, UA NEGATIVE NEGATIVE    Comment: MICROSCOPIC NOT DONE ON URINES WITH NEGATIVE PROTEIN, BLOOD, LEUKOCYTES, NITRITE, OR GLUCOSE <1000 mg/dL.  APTT     Status: None   Collection Time: 06/21/16 10:53 AM  Result Value Ref Range   aPTT 33 24 - 36 seconds  CBC WITH DIFFERENTIAL     Status: None   Collection Time: 06/21/16 10:53 AM  Result Value Ref Range   WBC 4.6 4.0 - 10.5 K/uL   RBC 5.07 4.22 - 5.81 MIL/uL   Hemoglobin 14.8 13.0 - 17.0 g/dL   HCT 49.1 40.7 - 59.0 %   MCV 83.8 78.0 - 100.0 fL   MCH 29.2 26.0 - 34.0 pg   MCHC 34.8 30.0 - 36.0 g/dL   RDW 04.3 51.9 - 99.4 %   Platelets 256 150 - 400 K/uL   Neutrophils Relative % 58 %   Neutro Abs 2.7 1.7 - 7.7 K/uL   Lymphocytes Relative 33 %   Lymphs Abs 1.5 0.7 - 4.0 K/uL   Monocytes Relative 7 %   Monocytes Absolute 0.3 0.1 - 1.0 K/uL   Eosinophils Relative 2 %   Eosinophils Absolute 0.1 0.0 - 0.7 K/uL   Basophils Relative 0 %   Basophils Absolute 0.0 0.0 - 0.1 K/uL  Comprehensive metabolic panel     Status: None   Collection Time: 06/21/16 10:53 AM  Result Value Ref Range   Sodium 138 135 - 145 mmol/L   Potassium 3.8 3.5 - 5.1 mmol/L   Chloride 107 101 - 111 mmol/L   CO2 24 22 - 32 mmol/L   Glucose, Bld 98 65 - 99 mg/dL   BUN 9 6 - 20 mg/dL   Creatinine, Ser 2.16 0.61 - 1.24 mg/dL   Calcium 9.0 8.9 - 08.2 mg/dL   Total Protein 7.3 6.5 - 8.1 g/dL   Albumin 3.9 3.5 - 5.0 g/dL   AST 24 15 - 41 U/L   ALT 18 17 - 63 U/L   Alkaline Phosphatase 67 38 - 126 U/L   Total Bilirubin 0.7 0.3 - 1.2 mg/dL   GFR calc non Af Amer >60 >60 mL/min   GFR calc Af Amer >60 >60 mL/min    Comment: (NOTE) The eGFR has been calculated using the CKD EPI equation. This calculation has not been validated in all clinical situations. eGFR's persistently <60 mL/min signify possible Chronic Kidney Disease.    Anion gap 7 5 - 15  Protime-INR     Status: None   Collection Time: 06/21/16 10:53 AM  Result Value Ref  Range   Prothrombin Time 14.5 11.4 - 15.2 seconds   INR 1.13   Type and screen Order type and screen if day of surgery is less than 15 days from draw of preadmission visit or order morning of surgery if day of surgery is greater than 6 days  from preadmission visit.     Status: None   Collection Time: 06/21/16 11:00 AM  Result Value Ref Range   ABO/RH(D) AB POS    Antibody Screen NEG    Sample Expiration 07/05/2016    Extend sample reason NO TRANSFUSIONS OR PREGNANCY IN THE PAST 3 MONTHS   ABO/Rh     Status: None   Collection Time: 06/21/16 11:00 AM  Result Value Ref Range   ABO/RH(D) AB POS    Estimated body mass index is 31.52 kg/m as calculated from the following:   Height as of this encounter: 6' (1.829 m).   Weight as of this encounter: 105.4 kg (232 lb 7 oz).   Imaging Review Plain radiographs demonstrate severe degenerative joint disease of the right hip(s). The bone quality appears to be good for age and reported activity level.  Assessment/Plan:  End stage arthritis, right hip(s)  The patient history, physical examination, clinical judgement of the provider and imaging studies are consistent with end stage degenerative joint disease of the right hip(s) and total hip arthroplasty is deemed medically necessary. The treatment options including medical management, injection therapy, arthroscopy and arthroplasty were discussed at length. The risks and benefits of total hip arthroplasty were presented and reviewed. The risks due to aseptic loosening, infection, stiffness, dislocation/subluxation,  thromboembolic complications and other imponderables were discussed.  The patient acknowledged the explanation, agreed to proceed with the plan and consent was signed. Patient is being admitted for inpatient treatment for surgery, pain control, PT, OT, prophylactic antibiotics, VTE prophylaxis, progressive ambulation and ADL's and discharge planning.The patient is planning to be discharged  home with home health services

## 2016-06-28 NOTE — Anesthesia Preprocedure Evaluation (Signed)
Anesthesia Evaluation  Patient identified by MRN, date of birth, ID band Patient awake    Reviewed: Allergy & Precautions, NPO status , Patient's Chart, lab work & pertinent test results  History of Anesthesia Complications Negative for: history of anesthetic complications  Airway Mallampati: II  TM Distance: >3 FB Neck ROM: Full    Dental  (+) Loose, Caps, Dental Advisory Given   Pulmonary Current Smoker,    breath sounds clear to auscultation       Cardiovascular hypertension, Pt. on medications (-) angina Rhythm:Regular Rate:Normal     Neuro/Psych negative neurological ROS     GI/Hepatic negative GI ROS, Neg liver ROS,   Endo/Other  Morbid obesity  Renal/GU negative Renal ROS     Musculoskeletal  (+) Arthritis ,   Abdominal (+) + obese,   Peds  Hematology negative hematology ROS (+)   Anesthesia Other Findings   Reproductive/Obstetrics                             Anesthesia Physical Anesthesia Plan  ASA: II  Anesthesia Plan: Spinal   Post-op Pain Management:    Induction:   Airway Management Planned: Natural Airway and Simple Face Mask  Additional Equipment:   Intra-op Plan:   Post-operative Plan:   Informed Consent: I have reviewed the patients History and Physical, chart, labs and discussed the procedure including the risks, benefits and alternatives for the proposed anesthesia with the patient or authorized representative who has indicated his/her understanding and acceptance.   Dental advisory given  Plan Discussed with: CRNA and Surgeon  Anesthesia Plan Comments: (Plan routine monitors, SAB)        Anesthesia Quick Evaluation

## 2016-06-28 NOTE — Discharge Instructions (Signed)

## 2016-06-28 NOTE — Transfer of Care (Signed)
Immediate Anesthesia Transfer of Care Note  Patient: [name] Stone  Procedure(s) Performed: Procedure(s): TOTAL HIP ARTHROPLASTY ANTERIOR APPROACH (Right)  Patient Location: PACU  Anesthesia Type:Spinal  Level of Consciousness: awake, alert  and oriented  Airway & Oxygen Therapy: Patient Spontanous Breathing and Patient connected to face mask oxygen  Post-op Assessment: Report given to RN and Post -op Vital signs reviewed and stable  Post vital signs: Reviewed and stable  Last Vitals:  Vitals:   06/28/16 0818  BP: (!) 157/101  Pulse: 72  Resp: 20  Temp: 37.2 C    Last Pain:  Vitals:   06/28/16 0924  TempSrc:   PainSc: 3       Patients Stated Pain Goal: 5 (06/28/16 0924)  Complications: No apparent anesthesia complications

## 2016-06-28 NOTE — Anesthesia Postprocedure Evaluation (Signed)
Anesthesia Post Note  Patient: [name] Stone  Procedure(s) Performed: Procedure(s) (LRB): TOTAL HIP ARTHROPLASTY ANTERIOR APPROACH (Right)  Patient location during evaluation: PACU Anesthesia Type: Spinal Level of consciousness: awake and alert, oriented and patient cooperative Pain management: pain level controlled Vital Signs Assessment: post-procedure vital signs reviewed and stable Respiratory status: spontaneous breathing, nonlabored ventilation, respiratory function stable and patient connected to nasal cannula oxygen Cardiovascular status: blood pressure returned to baseline and stable Postop Assessment: patient able to bend at knees, no signs of nausea or vomiting and spinal receding Anesthetic complications: no    Last Vitals:  Vitals:   06/28/16 1600 06/28/16 1619  BP:    Pulse: 86 80  Resp: 13 17  Temp:  36.6 C    Last Pain:  Vitals:   06/28/16 1619  TempSrc:   PainSc: 0-No pain    LLE Motor Response: Purposeful movement;Responds to commands (06/28/16 1619) LLE Sensation: Full sensation (06/28/16 1619) RLE Motor Response: Purposeful movement;Responds to commands (06/28/16 1619) RLE Sensation: Full sensation (06/28/16 1619) L Sensory Level: S1-Sole of foot, small toes (06/28/16 1619) R Sensory Level: S1-Sole of foot, small toes (06/28/16 1619)  Alison Kubicki,E. Keshana Klemz

## 2016-06-28 NOTE — Brief Op Note (Signed)
06/28/2016  12:00 PM  PATIENT:  [name] Stone  50 y.o. male  PRE-OPERATIVE DIAGNOSIS:  OSTEOARTHRITIS RIGHT HIP  POST-OPERATIVE DIAGNOSIS:  OSTEOARTHRITIS RIGHT HIP  PROCEDURE:  Procedure(s): TOTAL HIP ARTHROPLASTY ANTERIOR APPROACH (Right)  SURGEON:  Surgeon(s) and Role:    * Jodi GeraldsJohn Aubre Quincy, MD - Primary  PHYSICIAN ASSISTANT:   ASSISTANTS: bethune   ANESTHESIA:   spinal  EBL:  Total I/O In: 1400 [I.V.:1400] Out: 300 [Blood:300]  BLOOD ADMINISTERED:none  DRAINS: none   LOCAL MEDICATIONS USED:  MARCAINE and experel    SPECIMEN:  No Specimen  DISPOSITION OF SPECIMEN:  N/A  COUNTS:  YES  TOURNIQUET:  * No tourniquets in log *  DICTATION: .Other Dictation: Dictation Number none given  PLAN OF CARE: Admit to inpatient   PATIENT DISPOSITION:  PACU - hemodynamically stable.   Delay start of Pharmacological VTE agent (>24hrs) due to surgical blood loss or risk of bleeding: no

## 2016-06-28 NOTE — Anesthesia Procedure Notes (Signed)
Procedure Name: MAC Date/Time: 06/28/2016 10:25 AM Performed by: Charm BargesBUTLER, Shalayah Beagley R Pre-anesthesia Checklist: Patient identified, Emergency Drugs available, Suction available, Patient being monitored and Timeout performed Oxygen Delivery Method: Simple face mask Placement Confirmation: positive ETCO2 Dental Injury: Teeth and Oropharynx as per pre-operative assessment

## 2016-06-28 NOTE — Care Management Note (Addendum)
   Case Management Note  Patient Details  Name: [name] Stone MRN: [id number] Date of Birth: [birth year]  Subjective/Objective:    S/p right Hip Arthroplasty               Action/Plan: Discharge Planning: [facility]   1740 NCM spoke to pt's wife, WoodmereMonica. States pt does not have RW or 3n1 bedside commode at home. [facility]. Will order DME on 06/29/2016.   Expected Discharge Date:                  Expected Discharge Plan:  Home w Home Health Services  In-House Referral:  NA  Discharge planning Services  CM Consult  Post Acute Care Choice:  Home Health Choice offered to:  Patient  DME Arranged:  3-N-1, Walker rolling DME Agency:  [facility]  [facility]  [facility]  [facility]  Status of Service:  In process, will continue to follow  If discussed at Long Length of Stay Meetings, dates discussed:    Additional Comments:  Elliot CousinShavis, Abdimalik Mayorquin Ellen, RN 06/28/2016, 4:54 PM

## 2016-06-28 NOTE — Anesthesia Procedure Notes (Signed)
Spinal  Patient location during procedure: OR End time: 06/28/2016 10:13 AM Staffing Anesthesiologist: Jairo BenJACKSON, Yarenis Cerino Performed: anesthesiologist  Preanesthetic Checklist Completed: patient identified, site marked, surgical consent, pre-op evaluation, timeout performed, IV checked, risks and benefits discussed and monitors and equipment checked Spinal Block Patient position: sitting Prep: ChloraPrep and site prepped and draped Patient monitoring: heart rate, cardiac monitor, continuous pulse ox and blood pressure Approach: midline Location: L3-4 Needle Needle type: Quincke  Needle gauge: 25 G Additional Notes Pt identified in Operating room.  Monitors applied. Working IV access confirmed. Sterile prep, drape lumbar spine.  1% lido local L 3,4.  #25ga Quincke into clear CSF L 3,4.  20 mg 0.5% Bupivacaine injected with asp CSF beginning and end of injection.  Patient asymptomatic, VSS, no heme aspirated, tolerated well.  Sandford Craze Jobina Maita, MD

## 2016-06-29 LAB — CBC
HCT: 39.3 % (ref 39.0–52.0)
HEMOGLOBIN: 13.4 g/dL (ref 13.0–17.0)
MCH: 28.5 pg (ref 26.0–34.0)
MCHC: 34.1 g/dL (ref 30.0–36.0)
MCV: 83.4 fL (ref 78.0–100.0)
PLATELETS: 244 10*3/uL (ref 150–400)
RBC: 4.71 MIL/uL (ref 4.22–5.81)
RDW: 12.8 % (ref 11.5–15.5)
WBC: 10 10*3/uL (ref 4.0–10.5)

## 2016-06-29 LAB — BASIC METABOLIC PANEL
ANION GAP: 8 (ref 5–15)
BUN: 8 mg/dL (ref 6–20)
CALCIUM: 8.9 mg/dL (ref 8.9–10.3)
CO2: 25 mmol/L (ref 22–32)
Chloride: 103 mmol/L (ref 101–111)
Creatinine, Ser: 1.09 mg/dL (ref 0.61–1.24)
Glucose, Bld: 137 mg/dL — ABNORMAL HIGH (ref 65–99)
Potassium: 4.1 mmol/L (ref 3.5–5.1)
SODIUM: 136 mmol/L (ref 135–145)

## 2016-06-29 NOTE — Progress Notes (Signed)
Subjective: 1 Day Post-Op Procedure(s) (LRB): TOTAL HIP ARTHROPLASTY ANTERIOR APPROACH (Right) Patient reports pain as mild.  Taking by mouth. Voiding okay.  Objective: Vital signs in last 24 hours: Temp:  [97.8 F (36.6 C)-99.2 F (37.3 C)] 99.2 F (37.3 C) (09/30 0420) Pulse Rate:  [53-86] 77 (09/30 0420) Resp:  [9-22] 17 (09/30 0420) BP: (130-143)/(83-100) 130/83 (09/30 0420) SpO2:  [95 %-100 %] 98 % (09/30 0420)  Intake/Output from previous day: 09/29 0701 - 09/30 0700 In: 1990 [P.O.:370; I.V.:1620] Out: 900 [Urine:600; Blood:300] Intake/Output this shift: No intake/output data recorded.   Recent Labs  06/29/16 0523  HGB 13.4    Recent Labs  06/29/16 0523  WBC 10.0  RBC 4.71  HCT 39.3  PLT 244    Recent Labs  06/29/16 0523  NA 136  K 4.1  CL 103  CO2 25  BUN 8  CREATININE 1.09  GLUCOSE 137*  CALCIUM 8.9   No results for input(s): LABPT, INR in the last 72 hours. Right hip exam: Neurovascular intact Sensation intact distally Intact pulses distally Dorsiflexion/Plantar flexion intact Incision: dressing C/D/I Compartment soft  Assessment/Plan: 1 Day Post-Op Procedure(s) (LRB): TOTAL HIP ARTHROPLASTY ANTERIOR APPROACH (Right) Plan: Aspirin 325 mg twice daily for DVT prophylaxis. 1 month postop. Up with therapy Discharge home with home health after physical therapy. Follow-up with Dr. Luiz BlareGraves in 2 weeks.  [name] [name] Hasani Diemer PA-C  06/29/2016, 8:42 AM

## 2016-06-29 NOTE — Op Note (Signed)
NAMJilda [name]:  [name], [name]                ACCOUNT NO.:  [id number]  MEDICAL RECORD NO.:  [id number]  LOCATION:  [id number]                        FACILITY:  [facility]  PHYSICIAN:  Harvie JuniorJohn L. Jamaar Howes, M.D.   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  PROCEDURE: 1. Right total hip replacement with a Corail stem size 12 high offset,     56 mm Pinnacle porous-coated cup with a +4 neutral liner, and a 36     mm +1.5 delta ceramic hip ball. 2. Interpretation of multiple intraoperative fluoroscopic images.  SURGEON:  Harvie JuniorJohn L. Lolly Glaus, M.D.  ASSISTANT:  [name] [name], P.A.  ANESTHESIA:  General.  BRIEF HISTORY:  [name] [name] is a 50 year old male with a long history of significant complaints of right hip pain.  He had been treated conservatively for prolonged period of time.  X-ray shows severe changes of the head and acetabulum with severe bone-on-bone change.  After failure of all conservative care, he was taken to the operating room for right total hip replacement.  The patient was having night pain and light activity pain and failed intra-articular steroid injection.  DESCRIPTION OF PROCEDURE:  The patient was brought to the operating room.  After adequate anesthesia was obtained with general anesthetic, the patient was placed supine on operating table and then moved onto the Hana bed after boots were placed.  Attention then turned to the right hip and routine prep and drape was performed; and following this, an incision was made for an anterior approach to the hip, subcutaneous tissue down to the level of the tensor fascia.  Tensor fascia was identified and the fascia was divided.  Finger retraction was  used to get behind the tensor fascia, and then retractors put in place above the femoral neck.  The capsule was opened and tagged and then released anteriorly.  Once this was done, a provisional neck cut was made. Attention turned to the acetabulum.  Retractors were put in place and the acetabulum was sequentially reamed to a level of 55 mm and a 56 mm porous-coated cup was hammered into place with 45 degrees of lateral opening and 30 degrees of anteversion.  A rongeur was used to remove anterior osteophytes; and at this point, attention was turned to the stem.  The elevation retractors used behind the femur and the femur put in an externally rotated position and adducted position and used a [name], followed by a chili pepper, followed by sequential rasping up to a level of 12 mm, got an excellent fit and fill.  Did a reduction with a neutral stem normal offset, that showed the hip to be long by about 7 mm.  I felt we were down pretty close to the lesser trochanter.  I did think we could make any more up in the way of neck cut, we could go with a minus ball, but that would only give us some, but he was a  young man, I felt ceramic would be better.  At that point, we went to the high offset stem, which gave [place] symmetric leg lengths and trialed this, still gave [place] that may have been a millimeter too short, but that was going to make it better than be a long.  We did test multiple times for intraoperative stability with 90 degrees of external rotation and 60 degrees of extension.  At this point, the final 12 Corail stem high offset was opened in place.  The +1.5 delta ceramic hip ball was placed 36 mm and a reduction was undertaken.  Final images were taken.  The hip was irrigated and suctioned dry.  The capsule, at this point, was closed with #1 Vicryl interrupted and the tensor fascia closed with 0 Vicryl running, skin with 2-0 Vicryl and Monocryl subcuticular.  Benzoin and  Steri-Strips were applied.  Sterile compressive dressing was applied.  The patient was taken to the recovery and noted to be in satisfactory condition.  Estimated blood loss for the procedure was 300 mL, but the actual blood loss can be gotten from the anesthetic record.  At this point, the patient was taken to recovery and noted to be in satisfactory condition.  Multiple intraoperative fluoroscopic images were taken to assess the size of the stem and leg length.     Harvie Junior, M.D.     Ranae Plumber  D:  06/28/2016  T:  06/29/2016  Job:  [id number]

## 2016-06-29 NOTE — Progress Notes (Signed)
Physical Therapy Evaluation Patient Details Name: [name] [name] MRN: [id number] DOB: February 29, 1966 Today's Date: 06/29/2016   History of Present Illness  S/P R Anterior toatal hip replacement on 06/29/2016. History of OA in the right hip.   Clinical Impression  Patient tolerated treatment well. He was able to perform exercises without increase in pain. He ambulated with improved cadence. He would benefit from home health PT at d/c    Follow Up Recommendations Home health PT    Equipment Recommendations  Rolling walker with 5" wheels    Recommendations for Other Services       Precautions / Restrictions Precautions Precautions: Anterior Hip Precaution Comments: Per MD No percautions  Restrictions Weight Bearing Restrictions: Yes RLE Weight Bearing: Weight bearing as tolerated      Mobility  Bed Mobility Overal bed mobility: Independent                Transfers Overall transfer level: Needs assistance Equipment used: Rolling walker (2 wheeled) Transfers: Sit to/from Stand Sit to Stand: Supervision         General transfer comment: Cuing for hand placement upon standing   Ambulation/Gait Ambulation/Gait assistance: Supervision Ambulation Distance (Feet): 150 Feet Assistive device: Rolling walker (2 wheeled) Gait Pattern/deviations: Step-through pattern;Decreased step length - right;Decreased stance time - right   Gait velocity interpretation: >2.62 ft/sec, indicative of independent community ambulator    Stairs Stairs:  (Patient does not have stairs into house )          Wheelchair Mobility    Modified Rankin (Stroke Patients Only)       Balance Overall balance assessment: No apparent balance deficits (not formally assessed)                                           Pertinent Vitals/Pain Pain Assessment: 0-10 Pain Score: 2  Pain Descriptors / Indicators: Sore Pain Intervention(s): Limited activity within patient's  tolerance;Monitored during session;Premedicated before session;Repositioned;Ice applied    Home Living Family/patient expects to be discharged to:: Private residence Living Arrangements: Spouse/significant other;Children Available Help at Discharge: Family Type of Home: House Home Access: Ramped entrance     Home Layout: One level Home Equipment: Environmental consultant - 2 wheels;Bedside commode Additional Comments: equipment to be delivered     Prior Function Level of Independence: Independent               Hand Dominance   Dominant Hand: Right    Extremity/Trunk Assessment   Upper Extremity Assessment: Overall WFL for tasks assessed           Lower Extremity Assessment: Defer to PT evaluation RLE Deficits / Details: active motion limited by pain in the right hip. 5/5 strength for the left LE     Cervical / Trunk Assessment: Normal  Communication   Communication: No difficulties  Cognition Arousal/Alertness: Awake/alert Behavior During Therapy: WFL for tasks assessed/performed Overall Cognitive Status: Within Functional Limits for tasks assessed                      General Comments General comments (skin integrity, edema, etc.): Patient educated on stair technique depsite not having stairs at his house    Exercises Total Joint Exercises Ankle Circles/Pumps: 10 reps Quad Sets: 10 reps Gluteal Sets: 10 reps Short Arc Quad: 10 reps Heel Slides: 10 reps   Assessment/Plan    PT  Assessment Patient needs continued PT services  PT Problem List Decreased strength;Decreased range of motion;Decreased activity tolerance;Decreased mobility;Pain          PT Treatment Interventions Gait training;Stair training;Therapeutic activities;Therapeutic exercise;Functional mobility training;Neuromuscular re-education;Manual techniques;Patient/family education;Modalities    PT Goals (Current goals can be found in the Care Plan section)  Acute Rehab PT Goals Patient Stated  Goal: To go home PT Goal Formulation: With patient Time For Goal Achievement: 07/13/16 Potential to Achieve Goals: Good    Frequency 7X/week   Barriers to discharge   No barriers     Co-evaluation               End of Session Equipment Utilized During Treatment: Gait belt Activity Tolerance: Patient tolerated treatment well Patient left: in chair;with call bell/phone within reach;with family/visitor present Nurse Communication: Mobility status         Time: 1140-1200 PT Time Calculation (min) (ACUTE ONLY): 20 min   Charges:   PT Evaluation $PT Eval Low Complexity: 1 Procedure PT Treatments $Gait Training: 8-22 mins   PT G Codes:        Dessie Comaavid J Ivannah Zody PT DPT  06/29/2016, 1:43 PM

## 2016-06-29 NOTE — Plan of Care (Signed)
Physical Therapy Evaluation Patient Details Name: [name] [name] MRN: [id number] DOB: [birth year] Today's Date: 06/29/2016   History of Present Illness  S/P R Anterior toatal hip replacement on 06/29/2016. History of OA in the right hip.   Clinical Impression  Patient presents with decreased ambulation distance, expected wekaness, and expected loss of ROM. He is doing very well at this time. He should be able to discharge home with home health therapy. He would benefit from further skilled therapy to progress towards goals of independent mobility.     Follow Up Recommendations Home health PT    Equipment Recommendations   (Patient reports DME to be delivered )    Recommendations for Other Services       Precautions / Restrictions Precautions Precautions: Anterior Hip Precaution Comments: Per MD No percautions  Restrictions Weight Bearing Restrictions: Yes RLE Weight Bearing: Weight bearing as tolerated      Mobility  Bed Mobility Overal bed mobility: Independent                Transfers Overall transfer level: Needs assistance Equipment used: Rolling walker (2 wheeled) Transfers: Sit to/from Stand Sit to Stand: Supervision         General transfer comment: Cuing for hand placement upon standing   Ambulation/Gait Ambulation/Gait assistance: Supervision Ambulation Distance (Feet): 250 Feet Assistive device: Rolling walker (2 wheeled) Gait Pattern/deviations: Step-through pattern;Decreased step length - right;Decreased stance time - right   Gait velocity interpretation: >2.62 ft/sec, indicative of independent community ambulator    Stairs Stairs:  (Patient does not have stairs into house )          Wheelchair Mobility    Modified Rankin (Stroke Patients Only)       Balance Overall balance assessment: No apparent balance deficits (not formally assessed);Independent                                           Pertinent  Vitals/Pain Pain Assessment: 0-10 Pain Score: 4  Pain Descriptors / Indicators: Aching Pain Intervention(s): Limited activity within patient's tolerance;Monitored during session;Premedicated before session;Repositioned;Ice applied    Home Living Family/patient expects to be discharged to:: Private residence Living Arrangements: Spouse/significant other;Children Available Help at Discharge: Family Type of Home: House Home Access: Ramped entrance       Home Equipment: Environmental consultantWalker - 2 wheels;Bedside commode Additional Comments: equipment to be delivered     Prior Function Level of Independence: Independent               Hand Dominance   Dominant Hand: Right    Extremity/Trunk Assessment   Upper Extremity Assessment: Defer to OT evaluation           Lower Extremity Assessment: RLE deficits/detail RLE Deficits / Details: active motion limited by pain in the right hip. 5/5 strength for the left LE     Cervical / Trunk Assessment: Normal  Communication   Communication: No difficulties  Cognition Arousal/Alertness: Awake/alert                          General Comments General comments (skin integrity, edema, etc.): Patient educated on stair technique depsite not having stairs at his house    Exercises     Assessment/Plan    PT Assessment Patient needs continued PT services  PT Problem List Decreased strength;Decreased range of motion;Decreased activity tolerance;Decreased  mobility;Pain          PT Treatment Interventions Gait training;Stair training;Therapeutic activities;Therapeutic exercise;Functional mobility training;Neuromuscular re-education;Manual techniques;Patient/family education;Modalities    PT Goals (Current goals can be found in the Care Plan section)  Acute Rehab PT Goals Patient Stated Goal: To go home PT Goal Formulation: With patient Time For Goal Achievement: 07/13/16 Potential to Achieve Goals: Good    Frequency BID    Barriers to discharge   No barriers     Co-evaluation               End of Session Equipment Utilized During Treatment: Gait belt Activity Tolerance: Patient tolerated treatment well Patient left: in chair;with call bell/phone within reach;with family/visitor present Nurse Communication: Mobility status         Time: 0840-0900 PT Time Calculation (min) (ACUTE ONLY): 20 min   Charges:   PT Evaluation $PT Eval Low Complexity: 1 Procedure     PT G Codes:        Dessie Coma PT DPT  06/29/2016, 11:24 AM

## 2016-06-29 NOTE — Discharge Summary (Signed)
Patient ID: [name] [name] MRN: [id number] DOB/AGE: Oct 25, 1965 50 y.o.  Admit date: 06/28/2016 Discharge date: 06/29/2016  Admission Diagnoses:  Principal Problem:   Primary osteoarthritis of right hip   Discharge Diagnoses:  Same  Past Medical History:  Diagnosis Date  . Arthritis   . Headache    takes Excedrin Migraine as needed  . History of shingles   . Hypertension    takes Losartan daily  . Joint pain     Surgeries: Procedure(s):RIGHT TOTAL HIP ARTHROPLASTY ANTERIOR APPROACH on 06/28/2016   Discharged Condition: Improved  Hospital Course: [name] [name] is an 50 y.o. male who was admitted 06/28/2016 for operative treatment ofPrimary osteoarthritis of right hip. Patient has severe unremitting pain that affects sleep, daily activities, and work/hobbies. After pre-op clearance the patient was taken to the operating room on 06/28/2016 and underwent  Procedure(s):RIGHT TOTAL HIP ARTHROPLASTY ANTERIOR APPROACH.    Patient was given perioperative antibiotics: Anti-infectives    Start     Dose/Rate Route Frequency Ordered Stop   06/28/16 1730  ceFAZolin (ANCEF) IVPB 2g/100 mL premix     2 g 200 mL/hr over 30 Minutes Intravenous Every 6 hours 06/28/16 1652 06/28/16 2327   06/28/16 0906  ceFAZolin (ANCEF) IVPB 2g/100 mL premix     2 g 200 mL/hr over 30 Minutes Intravenous On call to O.R. 06/28/16 0906 06/28/16 1000       Patient was given sequential compression devices, early ambulation, and chemoprophylaxis to prevent DVT.  Patient benefited maximally from hospital stay and there were no complications.    Recent vital signs: Patient Vitals for the past 24 hrs:  BP Temp Temp src Pulse Resp SpO2  06/29/16 0420 130/83 99.2 F (37.3 C) Axillary 77 17 98 %  06/29/16 0027 132/83 98.9 F (37.2 C) Oral 79 17 96 %  06/28/16 2017 (!) 143/85 98.5 F (36.9 C) Oral 83 17 95 %  06/28/16 1619 - 97.8 F (36.6 C) - 80 17 100 %  06/28/16 1600 - - - 86 13 100 %  06/28/16 1530 - - - 73  16 100 %  06/28/16 1521 (!) 138/100 - - - - -  06/28/16 1500 - - - 67 15 100 %  06/28/16 1430 - - - 64 (!) 9 100 %  06/28/16 1351 - - - (!) 57 13 100 %  06/28/16 1345 - - - (!) 55 13 100 %  06/28/16 1336 (!) 142/94 - - - - -  06/28/16 1330 - - - (!) 53 13 100 %  06/28/16 1307 132/87 - - (!) 55 15 100 %     Recent laboratory studies:  Recent Labs  06/29/16 0523  WBC 10.0  HGB 13.4  HCT 39.3  PLT 244  NA 136  K 4.1  CL 103  CO2 25  BUN 8  CREATININE 1.09  GLUCOSE 137*  CALCIUM 8.9     Discharge Medications:     Medication List    TAKE these medications   aspirin EC 325 MG tablet Take 1 tablet (325 mg total) by mouth 2 (two) times daily after a meal. Take x 1 month post op to decrease risk of blood clots.   aspirin-acetaminophen-caffeine 250-250-65 MG tablet Commonly known as:  EXCEDRIN MIGRAINE Take 2 tablets by mouth every 6 (six) hours as needed for headache.   losartan 50 MG tablet Commonly known as:  COZAAR Take 50 mg by mouth daily.   methocarbamol 750 MG tablet Commonly known as:  ROBAXIN-750 Take 1 tablet (750 mg total) by mouth every 8 (eight) hours as needed for muscle spasms.   oxyCODONE-acetaminophen 5-325 MG tablet Commonly known as:  PERCOCET/ROXICET Take 1-2 tablets by mouth every 4 (four) hours as needed for severe pain.       Diagnostic Studies: Dg Chest 2 View  Result Date: 06/28/2016 CLINICAL DATA:  Patient for preoperative evaluation prior to total hip arthroplasty. EXAM: CHEST  2 VIEW COMPARISON:  Cyst radiograph 06/16/2009 FINDINGS: Normal cardiac and mediastinal contours. No consolidative pulmonary opacities. No pleural effusion pneumothorax. Regional skeleton is unremarkable. IMPRESSION: No active cardiopulmonary disease. Electronically Signed   By: Annia Beltrew  Davis M.D.   On: 06/28/2016 09:25   Dg C-arm 1-60 Min  Result Date: 06/28/2016 CLINICAL DATA:  Arthroplasty. EXAM: OPERATIVE RIGHT HIP (WITH PELVIS IF PERFORMED) 3 VIEWS TECHNIQUE:  Fluoroscopic spot image(s) were submitted for interpretation post-operatively. COMPARISON:  No prior. FINDINGS: Total right hip replacement. Anatomic alignment . Hardware intact. No acute bony abnormality identified. IMPRESSION: Total right hip replacement.  Anatomic alignment.  Hardware intact . Electronically Signed   By: Maisie Fushomas  Register   On: 06/28/2016 12:00   Dg Hip Operative Unilat W Or W/o Pelvis Right  Result Date: 06/28/2016 CLINICAL DATA:  Arthroplasty. EXAM: OPERATIVE RIGHT HIP (WITH PELVIS IF PERFORMED) 3 VIEWS TECHNIQUE: Fluoroscopic spot image(s) were submitted for interpretation post-operatively. COMPARISON:  No prior. FINDINGS: Total right hip replacement. Anatomic alignment . Hardware intact. No acute bony abnormality identified. IMPRESSION: Total right hip replacement.  Anatomic alignment.  Hardware intact . Electronically Signed   By: Maisie Fushomas  Register   On: 06/28/2016 12:00    Disposition:   Discharge Instructions    Call MD / Call 911    Complete by:  As directed    If you experience chest pain or shortness of breath, CALL 911 and be transported to the hospital emergency room.  If you develope a fever above 101 F, pus (white drainage) or increased drainage or redness at the wound, or calf pain, call your surgeon's office.   Constipation Prevention    Complete by:  As directed    Drink plenty of fluids.  Prune juice may be helpful.  You may use a stool softener, such as Colace (over the counter) 100 mg twice a day.  Use MiraLax (over the counter) for constipation as needed.   Diet general    Complete by:  As directed    Do not sit on low chairs, stoools or toilet seats, as it may be difficult to get up from low surfaces    Complete by:  As directed    Follow the hip precautions as taught in Physical Therapy    Complete by:  As directed    Increase activity slowly as tolerated    Complete by:  As directed    Weight bearing as tolerated    Complete by:  As directed     Laterality:  right   Extremity:  Lower      Follow-up Information    Kierre Deines G, PA-C. Schedule an appointment as soon as possible for a visit in 2 week(s).   Specialty:  Orthopedic Surgery Contact information: [facility] [address] [place] [address] [phone]        [facility] .   Why:  Home Health Physical Therapy Contact information: [address] [place] [address] [phone]            Signed: Marshia LyBETHUNE,Augustus Zurawski  G 06/29/2016, 1:05 PM

## 2016-06-29 NOTE — Progress Notes (Signed)
Occupational Therapy Evaluation Patient Details Name: [name] [name] L Ancheta MRN: [id number] DOB: 04-24-1966 Today's Date: 06/29/2016    History of Present Illness S/P R Anterior toatal hip replacement on 06/29/2016. History of OA in the right hip.    Clinical Impression   Pt is at min A level with LB ADLs and sup with ADL mobility and transfers using RW and 3 in 1. Pt will have family  assist at home. All education completed and no further acute OT indicated at this time    Follow Up Recommendations  No OT follow up;Supervision - Intermittent    Equipment Recommendations  3 in 1 bedside comode    Recommendations for Other Services       Precautions / Restrictions Precautions Precautions: Anterior Hip Precaution Comments: Per MD No percautions  Restrictions Weight Bearing Restrictions: Yes RLE Weight Bearing: Weight bearing as tolerated      Mobility Bed Mobility Overal bed mobility: Independent                Transfers Overall transfer level: Needs assistance Equipment used: Rolling walker (2 wheeled) Transfers: Sit to/from Stand Sit to Stand: Supervision         General transfer comment: Cuing for hand placement upon standing     Balance Overall balance assessment: No apparent balance deficits (not formally assessed)                                          ADL Overall ADL's : Needs assistance/impaired     Grooming: Wash/dry hands;Wash/dry face;Standing;Supervision/safety   Upper Body Bathing: Set up;Sitting   Lower Body Bathing: Minimal assistance   Upper Body Dressing : Set up   Lower Body Dressing: Minimal assistance   Toilet Transfer: Supervision/safety   Toileting- Clothing Manipulation and Hygiene: Min guard   Tub/ Shower Transfer: Supervision/safety;3 in 1   Functional mobility during ADLs: Supervision/safety General ADL Comments: Pt educated on ADL A/E and reviewed DME. DME has been delivered     Vision Vision  Assessment?: No apparent visual deficits          Pertinent Vitals/Pain Pain Assessment: 0-10 Pain Score: 2  Pain Descriptors / Indicators: Sore Pain Intervention(s): Limited activity within patient's tolerance;Monitored during session;Premedicated before session;Repositioned;Ice applied     Hand Dominance Right   Extremity/Trunk Assessment Upper Extremity Assessment Upper Extremity Assessment: Overall WFL for tasks assessed   Lower Extremity Assessment Lower Extremity Assessment: Defer to PT evaluation RLE Deficits / Details: active motion limited by pain in the right hip. 5/5 strength for the left LE    Cervical / Trunk Assessment Cervical / Trunk Assessment: Normal   Communication Communication Communication: No difficulties   Cognition Arousal/Alertness: Awake/alert Behavior During Therapy: WFL for tasks assessed/performed Overall Cognitive Status: Within Functional Limits for tasks assessed                     General Comments   pt very pleasant and cooperative                Home Living Family/patient expects to be discharged to:: Private residence Living Arrangements: Spouse/significant other;Children Available Help at Discharge: Family Type of Home: House Home Access: Ramped entrance     Home Layout: One level     Bathroom Shower/Tub: Chief Strategy OfficerTub/shower unit   Bathroom Toilet: Standard     Home Equipment: Environmental consultantWalker - 2 wheels;Bedside  commode   Additional Comments: equipment to be delivered       Prior Functioning/Environment Level of Independence: Independent                 OT Problem List: Pain;Decreased knowledge of use of DME or AE   OT Treatment/Interventions:      OT Goals(Current goals can be found in the care plan section) Acute Rehab OT Goals Patient Stated Goal: To go home OT Goal Formulation: With patient  OT Frequency:     Barriers to D/C:    no barriers                     End of Session Equipment Utilized  During Treatment: Rolling walker;Other (comment) (3 in 1)  Activity Tolerance: Patient tolerated treatment well Patient left: in chair;with call bell/phone within reach   Time: 1221-1247 OT Time Calculation (min): 26 min Charges:  OT General Charges $OT Visit: 1 Procedure OT Evaluation $OT Eval Moderate Complexity: 1 Procedure OT Treatments $Therapeutic Activity: 8-22 mins G-Codes:    Galen Manila 06/29/2016, 1:11 PM

## 2016-06-29 NOTE — Progress Notes (Signed)
[name] [name] to be D/C'd Home per MD order.  Discussed with the patient and all questions fully answered.  VSS, Skin clean, dry and intact without evidence of skin break down, no evidence of skin tears noted. IV catheter discontinued intact. Site without signs and symptoms of complications. Dressing and pressure applied.  An After Visit Summary was printed and given to the patient. Patient received prescription.  D/c education completed with patient/family including follow up instructions, medication list, d/c activities limitations if indicated, with other d/c instructions as indicated by MD - patient able to verbalize understanding, all questions fully answered.   Patient instructed to return to ED, call 911, or call MD for any changes in condition.   Patient will be escorted via WC, and D/C home via private auto.  [name] [name] 06/29/2016 2:44 PM

## 2016-07-01 ENCOUNTER — Encounter (HOSPITAL_COMMUNITY): Payer: Self-pay | Admitting: Orthopedic Surgery

## 2016-09-03 ENCOUNTER — Ambulatory Visit: Payer: BC Managed Care – PPO | Admitting: Neurology

## 2016-09-27 ENCOUNTER — Ambulatory Visit: Payer: BC Managed Care – PPO | Admitting: Diagnostic Neuroimaging

## 2016-10-02 ENCOUNTER — Encounter: Payer: Self-pay | Admitting: Diagnostic Neuroimaging

## 2016-10-17 ENCOUNTER — Ambulatory Visit: Payer: BC Managed Care – PPO | Admitting: Neurology

## 2017-11-11 IMAGING — RF DG C-ARM 61-120 MIN
1 series · 3 of 3 positions shown · non-contrast
Comparison: No prior.

CLINICAL DATA: Arthroplasty.

EXAM:
OPERATIVE RIGHT HIP (WITH PELVIS IF PERFORMED) 3 VIEWS
TECHNIQUE: Fluoroscopic spot image(s) were submitted for interpretation
post-operatively.

[Series 1: run · 3 of 3 slices shown]
[im 1/3]
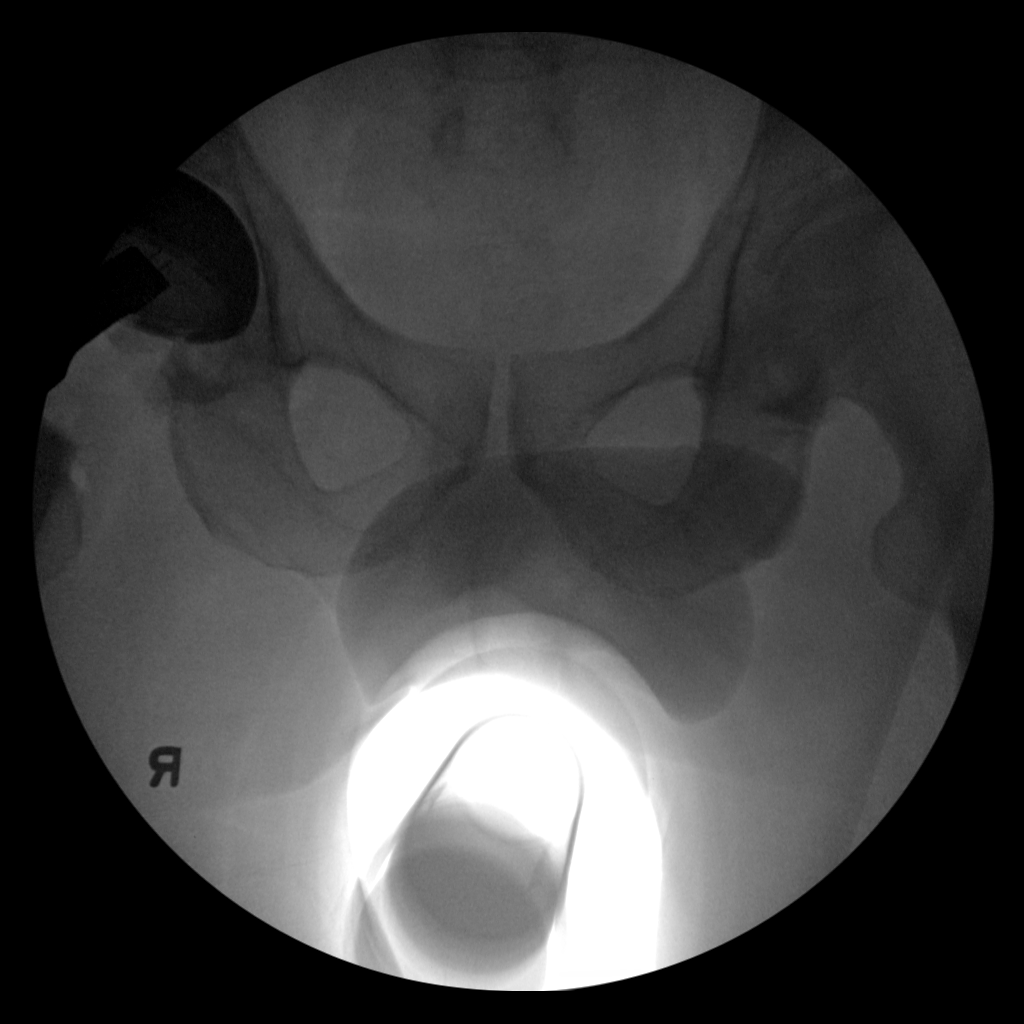
[im 2/3]
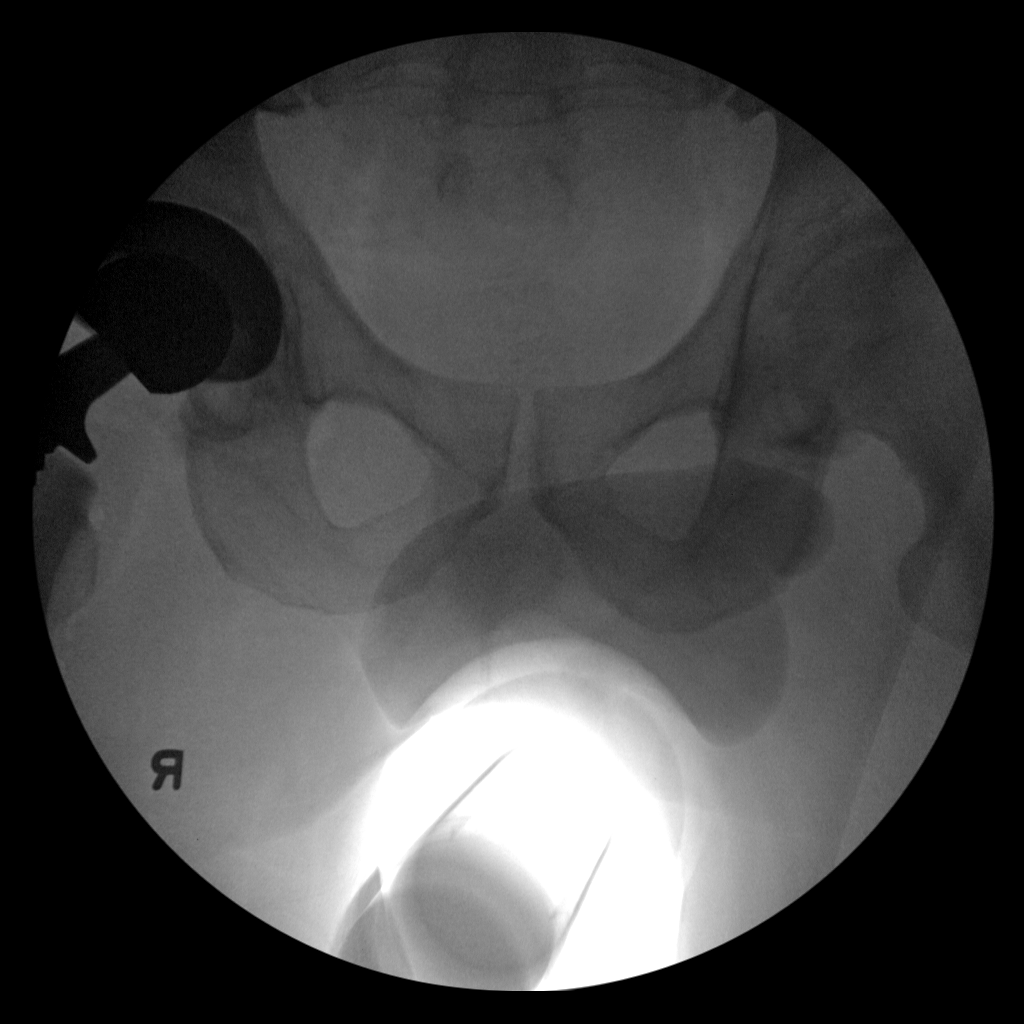
[im 3/3]
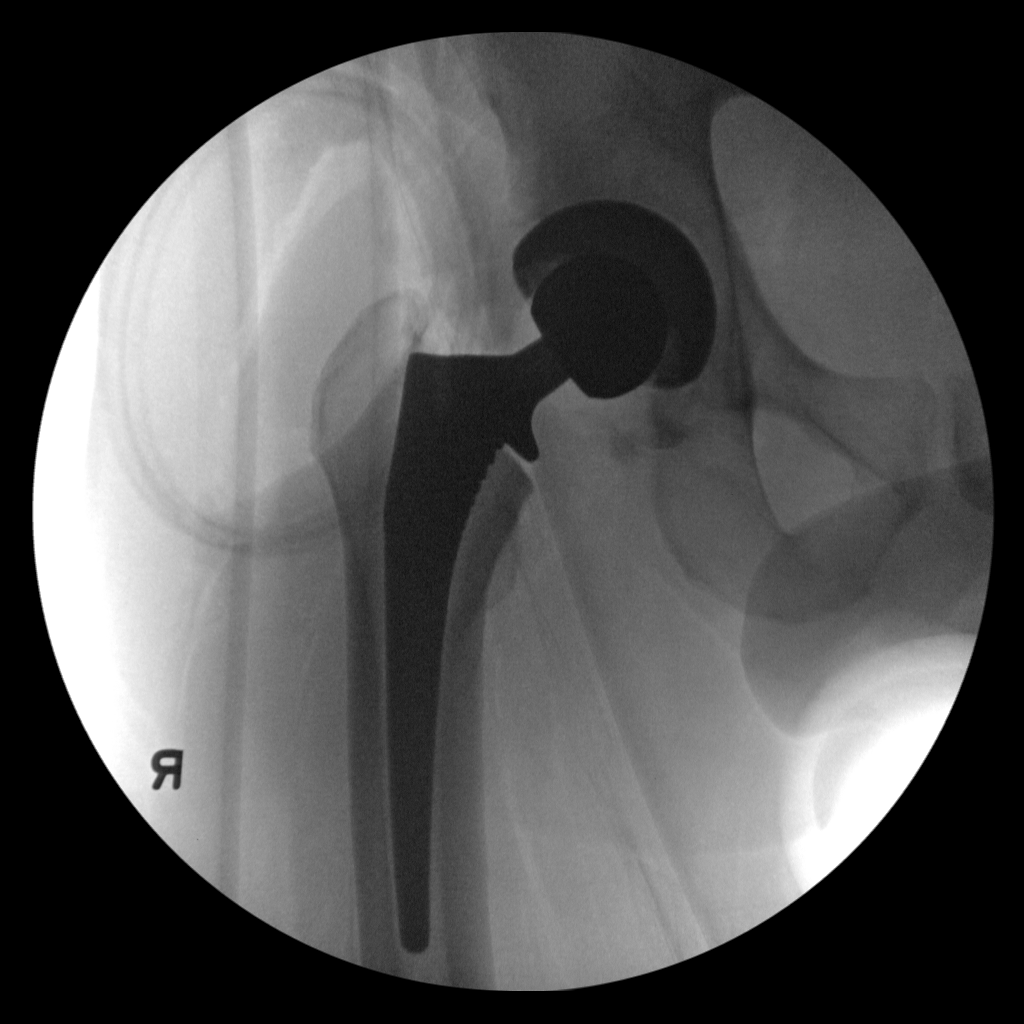

[3 of 3 positions shown; findings below may reference images not displayed]

FINDINGS: Total right hip replacement. Anatomic alignment . Hardware intact.
No acute bony abnormality identified.
IMPRESSION: Total right hip replacement.  Anatomic alignment.  Hardware intact .

## 2017-11-11 IMAGING — CR DG CHEST 2V
2 series · 2 of 2 positions shown · non-contrast
Comparison: Cyst radiograph 06/16/2009

CLINICAL DATA: Patient for preoperative evaluation prior to total
hip arthroplasty.

EXAM:
CHEST  2 VIEW

[w chest pa]
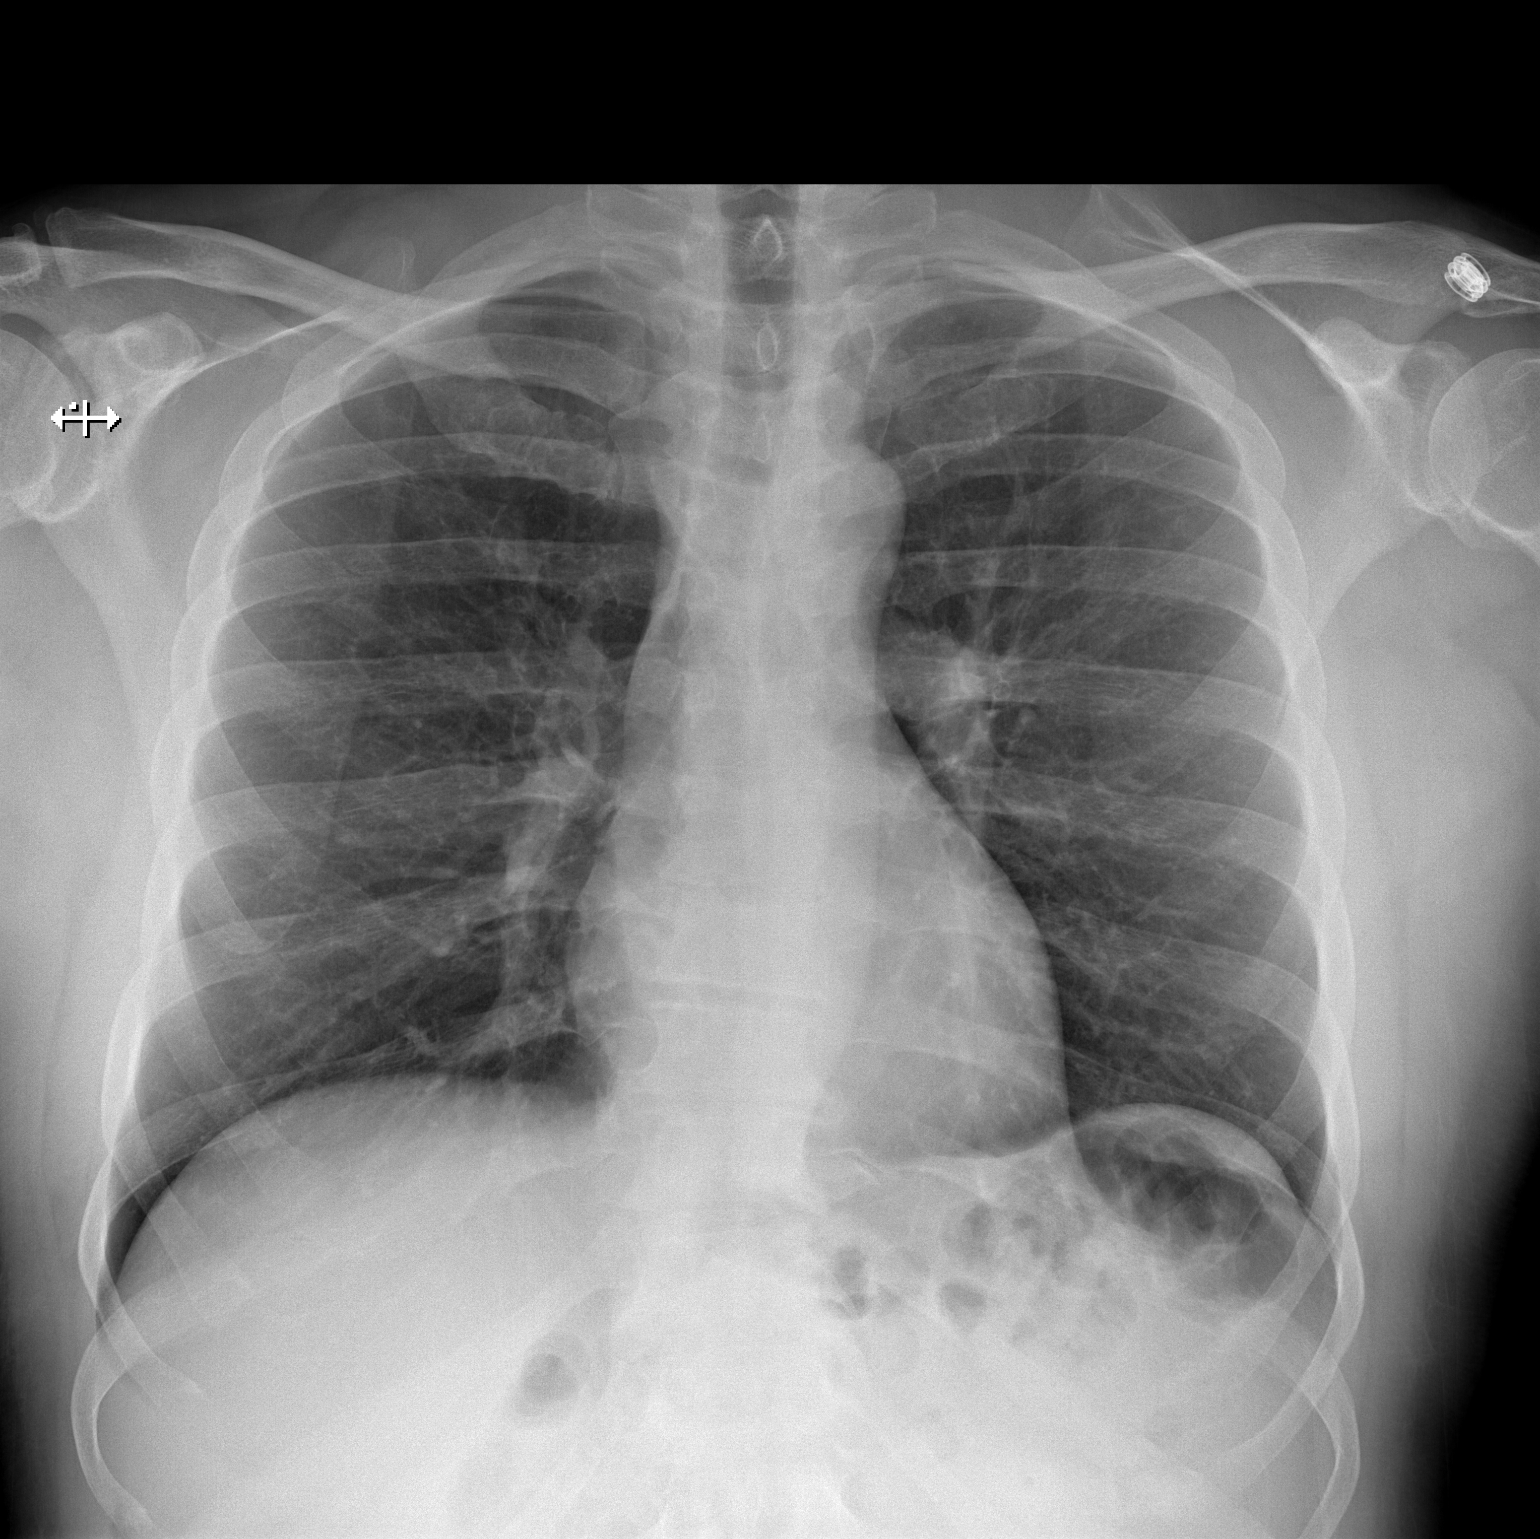

[w chest lat]
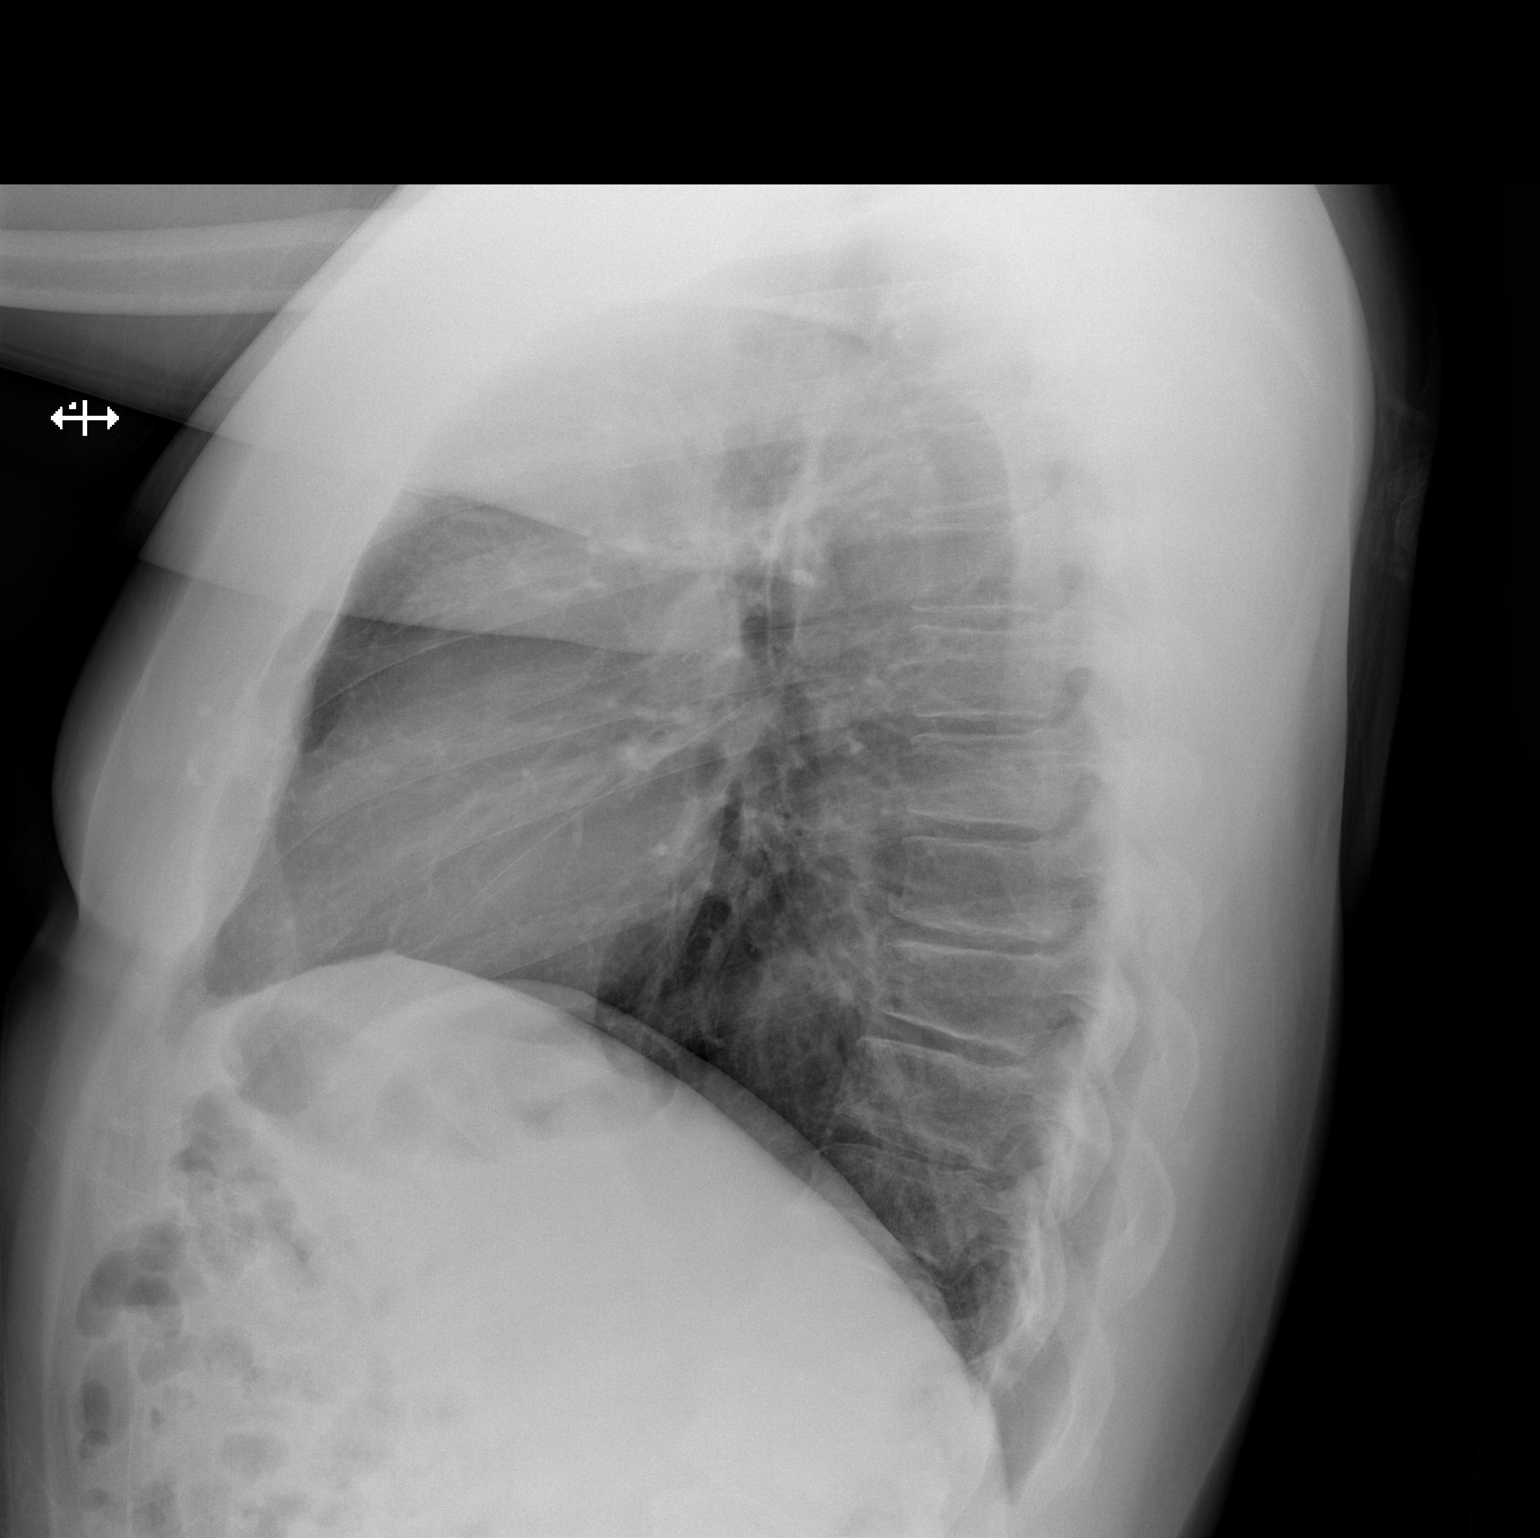

[2 of 2 positions shown; findings below may reference images not displayed]

FINDINGS: Normal cardiac and mediastinal contours. No consolidative pulmonary
opacities. No pleural effusion pneumothorax. Regional skeleton is
unremarkable.
IMPRESSION: No active cardiopulmonary disease.

## 2021-06-27 LAB — EXTERNAL GENERIC LAB PROCEDURE

## 2022-09-03 ENCOUNTER — Ambulatory Visit: Payer: Self-pay | Admitting: Surgery

## 2022-09-03 NOTE — H&P (Signed)
[name] Stone [id number]   Referring Provider:  Dayton Scrape, NP   Subjective   Chief Complaint: New Consultation and Hernia     History of Present Illness:    Very pleasant another wise healthy 56 year old man who presents with a right inguinal hernia.  He has known about this for several years, but it has not bothered him until recently.  He has had increasing size and discomfort from the hernia, and it has begun to protrude with more frequency and severity.  It has remained reducible.  No obstructive symptoms.  He has not had previous abdominal or hernia surgery.  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   Review of Systems: A complete review of systems was obtained from the patient.  I have reviewed this information and discussed as appropriate with the patient.  See HPI as well for other ROS.   Medical History: Past Medical History:  Diagnosis Date   Hypertension     There is no problem list on file for this patient.   Past Surgical History:  Procedure Laterality Date   JOINT REPLACEMENT       No Known Allergies  No current outpatient medications on file prior to visit.   No current facility-administered medications on file prior to visit.    History reviewed. No pertinent family history.   Social History   Tobacco Use  Smoking Status Never  Smokeless Tobacco Never     Social History   Socioeconomic History   Marital status: Married  Tobacco Use   Smoking status: Never   Smokeless tobacco: Never    Objective:    Vitals:   09/03/22 1526  BP: (!) 160/120  Pulse: 79  Temp: 36.1 C (97 F)  SpO2: 99%  Weight: 96.6 kg (213 lb)  Height: 182.9 cm (6')    Body mass index is 28.89 kg/m.  Gen: A&Ox3, no distress  Unlabored respirations Abdomen soft, nontender, nondistended.  Moderate reducible right inguinal hernia which is tender.  No overlying skin change.  No left inguinal hernia.  Assessment and Plan:  Diagnoses and all  orders for this visit:  Non-recurrent unilateral inguinal hernia without obstruction or gangrene    We discussed the relevant anatomy and options for treatment.  Given this is unilateral nonrecurrent, I discussed my typical recommendation is an open repair.  We discussed the technique of the procedure.  Discussed risks of bleeding, infection, pain, scarring, injury to structures in the area including nerves, blood vessels, bowel, bladder, risk of chronic pain, hernia recurrence, risk of seroma or hematoma, urinary retention, and risks of general anesthesia including cardiovascular, pulmonary, and thromboembolic complications.  We also discussed minimally invasive hernia repair and risks/benefits in comparison to open.  Questions were welcomed and answered.  Patient is agreeable to open right inguinal hernia repair and wishes to proceed with scheduling.   [name] Kissoon Carlye Grippe, MD

## 2023-07-11 ENCOUNTER — Ambulatory Visit: Payer: Self-pay | Admitting: Surgery

## 2023-07-11 NOTE — H&P (Signed)
[name] Stone [id number]   Referring Provider:  Dayton Scrape, NP   Subjective   Chief Complaint: FOLLOW UP VISIT (ONG TERM FOLLOW UP - [facility])     History of Present Illness:    Very pleasant other wise healthy 57 year old man who returns with a right inguinal hernia.  He has known about this for several years, but it began bothering him about a year ago which is when I first met him.  He has had increasing size and discomfort from the hernia, and it has begun to protrude with more frequency and severity.  It has remained reducible, but he has had a couple episodes of severe pain and incarceration, most recently when the took about 2 hours before he was able to reduce the hernia.  No obstructive symptoms.  He has not had previous abdominal or hernia surgery.  He has been wearing a hernia belt.  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   Review of Systems: A complete review of systems was obtained from the patient.  I have reviewed this information and discussed as appropriate with the patient.  See HPI as well for other ROS.   Medical History: Past Medical History:  Diagnosis Date   Hypertension     There is no problem list on file for this patient.   Past Surgical History:  Procedure Laterality Date   JOINT REPLACEMENT       No Known Allergies  Current Outpatient Medications on File Prior to Visit  Medication Sig Dispense Refill   terbinafine HCL (LAMISIL) 250 mg tablet Take 250 mg by mouth once daily     No current facility-administered medications on file prior to visit.    History reviewed. No pertinent family history.   Social History   Tobacco Use  Smoking Status Never  Smokeless Tobacco Never     Social History   Socioeconomic History   Marital status: Married  Tobacco Use   Smoking status: Never   Smokeless tobacco: Never  Vaping Use   Vaping status: Never Used  Substance and Sexual Activity   Drug use: Never   Social  Determinants of Health    Received from [facility]   Social Network    Objective:    Vitals:   07/11/23 1021  BP: (!) 176/108  Pulse: 62  Temp: 36.6 C (97.9 F)  Weight: 90.6 kg (199 lb 12.8 oz)  Height: 182.9 cm (6')  PainSc: 0-No pain    Body mass index is 27.1 kg/m.  Gen: A&Ox3, no distress  Unlabored respirations Abdomen soft, nontender, nondistended.  Moderate reducible right inguinal hernia which is tender.  No overlying skin change.  No left inguinal hernia.  Assessment and Plan:  Diagnoses and all orders for this visit:  Non-recurrent unilateral inguinal hernia without obstruction or gangrene  Other orders -     Order     We again discussed the relevant anatomy and mentation for open repair.  We discussed the technique of the procedure.  Discussed risks of bleeding, infection, pain, scarring, injury to structures in the area including nerves, blood vessels, bowel, bladder, risk of chronic pain, hernia recurrence, risk of seroma or hematoma, urinary retention, and risks of general anesthesia including cardiovascular, pulmonary, and thromboembolic complications.  Reviewed the postoperative activity limitations and timeline for recovery.  Questions were welcomed and answered.  Patient is agreeable to open right inguinal hernia repair and wishes to proceed with scheduling.   Venisha Boehning Carlye Grippe, MD

## 2023-07-22 ENCOUNTER — Encounter: Payer: Self-pay | Admitting: Gastroenterology

## 2023-09-09 NOTE — Patient Instructions (Addendum)
SURGICAL WAITING ROOM VISITATION Patients having surgery or a procedure may have no more than 2 support people in the waiting area - these visitors may rotate.    Children under the age of 13 must have an adult with them who is not the patient.  If the patient needs to stay at the hospital during part of their recovery, the visitor guidelines for inpatient rooms apply. Pre-op nurse will coordinate an appropriate time for 1 support person to accompany patient in pre-op.  This support person may not rotate.    Please refer to the [facility] website for the visitor guidelines for Inpatients (after your surgery is over and you are in a regular room).       Your procedure is scheduled on: 09-19-23   Report to [facility] Main Entrance    Report to admitting at 8:45 AM   Call this number if you have problems the morning of surgery [phone]   Do not eat food or drink liquids :After Midnight.           If you have questions, please contact your surgeon's office.   FOLLOW  ANY ADDITIONAL PRE OP INSTRUCTIONS YOU RECEIVED FROM YOUR SURGEON'S OFFICE!!!     Oral Hygiene is also important to reduce your risk of infection.                                    Remember - BRUSH YOUR TEETH THE MORNING OF SURGERY WITH YOUR REGULAR TOOTHPASTE   Do NOT smoke after Midnight   Take these medicines the morning of surgery with A SIP OF WATER: Singulair  Stop all vitamins and herbal supplements 7 days before surgery                              You may not have any metal on your body including  jewelry, and body piercing             Do not wear  lotions, powders, cologne, or deodorant              Men may shave face and neck.   Do not bring valuables to the hospital. [facility] IS NOT RESPONSIBLE   FOR VALUABLES.   Contacts, dentures or bridgework may not be worn into surgery.  DO NOT BRING YOUR HOME MEDICATIONS TO THE HOSPITAL. PHARMACY WILL DISPENSE MEDICATIONS LISTED ON YOUR  MEDICATION LIST TO YOU DURING YOUR ADMISSION IN THE HOSPITAL!    Patients discharged on the day of surgery will not be allowed to drive home.  Someone NEEDS to stay with you for the first 24 hours after anesthesia.               Please read over the following fact sheets you were given: IF YOU HAVE QUESTIONS ABOUT YOUR PRE-OP INSTRUCTIONS PLEASE CALL [phone] Gwen  If you received a COVID test during your pre-op visit  it is requested that you wear a mask when out in public, stay away from anyone that may not be feeling well and notify your surgeon if you develop symptoms. If you test positive for Covid or have been in contact with anyone that has tested positive in the last 10 days please notify you surgeon.  [facility] - Preparing for Surgery Before surgery, you can play an important role.  Because skin is  not sterile, your skin needs to be as free of germs as possible.  You can reduce the number of germs on your skin by washing with CHG (chlorahexidine gluconate) soap before surgery.  CHG is an antiseptic cleaner which kills germs and bonds with the skin to continue killing germs even after washing. Please DO NOT use if you have an allergy to CHG or antibacterial soaps.  If your skin becomes reddened/irritated stop using the CHG and inform your nurse when you arrive at Short Stay. Do not shave (including legs and underarms) for at least 48 hours prior to the first CHG shower.  You may shave your face/neck.  Please follow these instructions carefully:  1.  Shower with CHG Soap the night before surgery and the  morning of surgery.  2.  If you choose to wash your hair, wash your hair first as usual with your normal  shampoo.  3.  After you shampoo, rinse your hair and body thoroughly to remove the shampoo.                             4.  Use CHG as you would any other liquid soap.  You can apply chg directly to the skin and wash.  Gently with a scrungie or clean washcloth.  5.  Apply the CHG  Soap to your body ONLY FROM THE NECK DOWN.   Do   not use on face/ open                           Wound or open sores. Avoid contact with eyes, ears mouth and   genitals (private parts).                       Wash face,  Genitals (private parts) with your normal soap.             6.  Wash thoroughly, paying special attention to the area where your    surgery  will be performed.  7.  Thoroughly rinse your body with warm water from the neck down.  8.  DO NOT shower/wash with your normal soap after using and rinsing off the CHG Soap.                9.  Pat yourself dry with a clean towel.            10.  Wear clean pajamas.            11.  Place clean sheets on your bed the night of your first shower and do not  sleep with pets. Day of Surgery : Do not apply any lotions/deodorants the morning of surgery.  Please wear clean clothes to the hospital/surgery center.  FAILURE TO FOLLOW THESE INSTRUCTIONS MAY RESULT IN THE CANCELLATION OF YOUR SURGERY  PATIENT SIGNATURE_________________________________  NURSE SIGNATURE__________________________________  ________________________________________________________________________

## 2023-09-12 ENCOUNTER — Other Ambulatory Visit: Payer: Self-pay

## 2023-09-12 ENCOUNTER — Encounter (HOSPITAL_COMMUNITY)
Admission: RE | Admit: 2023-09-12 | Discharge: 2023-09-12 | Disposition: A | Discharge status: Home / Self Care | Payer: BC Managed Care – PPO | Source: Ambulatory Visit | Attending: Surgery | Admitting: Surgery

## 2023-09-12 ENCOUNTER — Encounter (HOSPITAL_COMMUNITY): Payer: Self-pay

## 2023-09-12 VITALS — BP 159/105 | HR 73 | Temp 98.2°F | Resp 12 | Ht 72.0 in | Wt 198.5 lb

## 2023-09-12 DIAGNOSIS — Z01818 Encounter for other preprocedural examination: Secondary | ICD-10-CM | POA: Insufficient documentation

## 2023-09-12 DIAGNOSIS — Z01812 Encounter for preprocedural laboratory examination: Secondary | ICD-10-CM | POA: Diagnosis present

## 2023-09-12 DIAGNOSIS — Z01810 Encounter for preprocedural cardiovascular examination: Secondary | ICD-10-CM | POA: Diagnosis present

## 2023-09-12 DIAGNOSIS — I10 Essential (primary) hypertension: Secondary | ICD-10-CM | POA: Insufficient documentation

## 2023-09-12 LAB — CBC
HCT: 44.2 % (ref 39.0–52.0)
Hemoglobin: 14.7 g/dL (ref 13.0–17.0)
MCH: 29.5 pg (ref 26.0–34.0)
MCHC: 33.3 g/dL (ref 30.0–36.0)
MCV: 88.6 fL (ref 80.0–100.0)
Platelets: 256 10*3/uL (ref 150–400)
RBC: 4.99 MIL/uL (ref 4.22–5.81)
RDW: 12.7 % (ref 11.5–15.5)
WBC: 4.7 10*3/uL (ref 4.0–10.5)
nRBC: 0 % (ref 0.0–0.2)

## 2023-09-12 LAB — BASIC METABOLIC PANEL
Anion gap: 5 (ref 5–15)
BUN: 14 mg/dL (ref 6–20)
CO2: 27 mmol/L (ref 22–32)
Calcium: 9 mg/dL (ref 8.9–10.3)
Chloride: 104 mmol/L (ref 98–111)
Creatinine, Ser: 1.16 mg/dL (ref 0.61–1.24)
GFR, Estimated: >60 mL/min (ref >60)
Glucose, Bld: 94 mg/dL (ref 70–99)
Potassium: 4.1 mmol/L (ref 3.5–5.1)
Sodium: 136 mmol/L (ref 135–145)

## 2023-09-12 NOTE — Progress Notes (Addendum)
COVID Vaccine Completed:  Date of COVID positive in last 90 days:  No  PCP - Dayton Scrape, FNP (office note requested) Cardiologist - N/A  Chest x-ray - N/A EKG - 09-12-23 Epic Stress Test - N/A ECHO - N/A Cardiac Cath - N/A Pacemaker/ICD device last checked: Spinal Cord Stimulator:N/A  Bowel Prep - N/A  Sleep Study - N/A CPAP -   Fasting Blood Sugar - N/A Checks Blood Sugar _____ times a day  Last dose of GLP1 agonist-  N/A GLP1 instructions:  Hold 7 days before surgery    Last dose of SGLT-2 inhibitors-  N/A SGLT-2 instructions:  Hold 3 days before surgery    Blood Thinner Instructions:  N/A Aspirin Instructions: Last Dose:  Activity level:  Can go up a flight of stairs and perform activities of daily living without stopping and without symptoms of chest pain or shortness of breath.  Able to exercise without symptoms  Anesthesia review:  BP elevated at PAT 164/114 and on recheck 159/105.  Patient has a diagnosis of HTN but not on meds.  He stated that his PCP has recommended that he restart.  He was advised that his surgery could be canceled if BP elevated DOS.  He will recheck this afternoon and call his PCP if still elevated to get RX.  Denied headache, chest pain or SOB.  Patient denies shortness of breath, fever, cough and chest pain at PAT appointment  Patient verbalized understanding of instructions that were given to them at the PAT appointment. Patient was also instructed that they will need to review over the PAT instructions again at home before surgery.

## 2023-09-15 NOTE — Anesthesia Preprocedure Evaluation (Addendum)
Anesthesia Evaluation  Patient identified by MRN, date of birth, ID band Patient awake    Reviewed: Allergy & Precautions, NPO status , Patient's Chart, lab work & pertinent test results  History of Anesthesia Complications Negative for: history of anesthetic complications  Airway Mallampati: II  TM Distance: >3 FB Neck ROM: Full    Dental no notable dental hx.    Pulmonary former smoker   Pulmonary exam normal        Cardiovascular hypertension, Normal cardiovascular exam     Neuro/Psych  Headaches    GI/Hepatic Neg liver ROS,,,INGUINAL HERNIA   Endo/Other  negative endocrine ROS    Renal/GU negative Renal ROS     Musculoskeletal  (+) Arthritis ,    Abdominal   Peds  Hematology negative hematology ROS (+)   Anesthesia Other Findings Day of surgery medications reviewed with patient.  Reproductive/Obstetrics                              Anesthesia Physical Anesthesia Plan  ASA: 2  Anesthesia Plan: General   Post-op Pain Management: Tylenol PO (pre-op)*   Induction: Intravenous  PONV Risk Score and Plan: 2 and Treatment may vary due to age or medical condition, Ondansetron, Dexamethasone and Midazolam  Airway Management Planned: Oral ETT  Additional Equipment: None  Intra-op Plan:   Post-operative Plan: Extubation in OR  Informed Consent: I have reviewed the patients History and Physical, chart, labs and discussed the procedure including the risks, benefits and alternatives for the proposed anesthesia with the patient or authorized representative who has indicated his/her understanding and acceptance.     Dental advisory given  Plan Discussed with: CRNA  Anesthesia Plan Comments: (Reviewed. Pt's BP was uncontrolled at PAT visit. He was advised to f/u with PCP and potentially start BP meds prior to surgery)         Anesthesia Quick Evaluation

## 2023-09-19 ENCOUNTER — Other Ambulatory Visit: Payer: Self-pay

## 2023-09-19 ENCOUNTER — Ambulatory Visit (HOSPITAL_COMMUNITY)
Admission: RE | Admit: 2023-09-19 | Discharge: 2023-09-19 | Disposition: A | Discharge status: Home / Self Care | Payer: BC Managed Care – PPO | Source: Ambulatory Visit | Attending: Surgery | Admitting: Surgery

## 2023-09-19 ENCOUNTER — Encounter (HOSPITAL_COMMUNITY): Payer: Self-pay | Admitting: Surgery

## 2023-09-19 ENCOUNTER — Other Ambulatory Visit (HOSPITAL_COMMUNITY): Payer: Self-pay

## 2023-09-19 ENCOUNTER — Encounter (HOSPITAL_COMMUNITY)
Admission: RE | Disposition: A | Discharge status: Home / Self Care | Payer: Self-pay | Source: Ambulatory Visit | Attending: Surgery

## 2023-09-19 ENCOUNTER — Ambulatory Visit (HOSPITAL_COMMUNITY): Payer: BC Managed Care – PPO | Admitting: Anesthesiology

## 2023-09-19 ENCOUNTER — Ambulatory Visit (HOSPITAL_COMMUNITY): Payer: BC Managed Care – PPO | Admitting: Medical

## 2023-09-19 DIAGNOSIS — M1990 Unspecified osteoarthritis, unspecified site: Secondary | ICD-10-CM | POA: Insufficient documentation

## 2023-09-19 DIAGNOSIS — K4090 Unilateral inguinal hernia, without obstruction or gangrene, not specified as recurrent: Secondary | ICD-10-CM | POA: Diagnosis present

## 2023-09-19 DIAGNOSIS — I10 Essential (primary) hypertension: Secondary | ICD-10-CM | POA: Insufficient documentation

## 2023-09-19 HISTORY — PX: INGUINAL HERNIA REPAIR: SHX194

## 2023-09-19 SURGERY — REPAIR, HERNIA, INGUINAL, ADULT
Anesthesia: General | Laterality: Right

## 2023-09-19 MED ORDER — CEFAZOLIN SODIUM-DEXTROSE 2-4 GM/100ML-% IV SOLN
2.0000 g | INTRAVENOUS | Status: AC
Start: 2023-09-19 — End: 2023-09-19
  Administered 2023-09-19: 2 g via INTRAVENOUS
  Filled 2023-09-19: qty 100

## 2023-09-19 MED ORDER — ACETAMINOPHEN 500 MG PO TABS: 1000.0000 mg | ORAL_TABLET | Freq: Once | ORAL | Status: DC

## 2023-09-19 MED ORDER — FENTANYL CITRATE (PF) 100 MCG/2ML IJ SOLN
INTRAMUSCULAR | Status: AC
  Filled 2023-09-19: qty 2

## 2023-09-19 MED ORDER — DOCUSATE SODIUM 100 MG PO CAPS
100.0000 mg | ORAL_CAPSULE | Freq: Two times a day (BID) | ORAL | 0 refills | Status: DC
  Filled 2023-09-19: qty 30, 15d supply, fill #0

## 2023-09-19 MED ORDER — 0.9 % SODIUM CHLORIDE (POUR BTL) OPTIME
TOPICAL | Status: DC | PRN
  Administered 2023-09-19: 1000 mL

## 2023-09-19 MED ORDER — BUPIVACAINE-EPINEPHRINE 0.25% -1:200000 IJ SOLN
INTRAMUSCULAR | Status: AC
  Filled 2023-09-19: qty 1

## 2023-09-19 MED ORDER — PROPOFOL 10 MG/ML IV BOLUS
INTRAVENOUS | Status: DC | PRN
  Administered 2023-09-19: 50 ug/kg/min via INTRAVENOUS
  Administered 2023-09-19: 200 mg via INTRAVENOUS

## 2023-09-19 MED ORDER — FENTANYL CITRATE PF 50 MCG/ML IJ SOSY: 25.0000 ug | PREFILLED_SYRINGE | INTRAMUSCULAR | Status: DC | PRN

## 2023-09-19 MED ORDER — ROCURONIUM BROMIDE 100 MG/10ML IV SOLN
INTRAVENOUS | Status: DC | PRN
  Administered 2023-09-19: 50 mg via INTRAVENOUS

## 2023-09-19 MED ORDER — BUPIVACAINE LIPOSOME 1.3 % IJ SUSP: 20.0000 mL | Freq: Once | INTRAMUSCULAR | Status: DC

## 2023-09-19 MED ORDER — GLYCOPYRROLATE 0.2 MG/ML IJ SOLN
INTRAMUSCULAR | Status: DC | PRN
  Administered 2023-09-19: .2 mg via INTRAVENOUS

## 2023-09-19 MED ORDER — CHLORHEXIDINE GLUCONATE 4 % EX SOLN: 60.0000 mL | Freq: Once | CUTANEOUS | Status: DC

## 2023-09-19 MED ORDER — OXYCODONE HCL 5 MG PO TABS
5.0000 mg | ORAL_TABLET | Freq: Three times a day (TID) | ORAL | 0 refills | Status: AC | PRN
  Filled 2023-09-19: qty 15, 5d supply, fill #0

## 2023-09-19 MED ORDER — FENTANYL CITRATE (PF) 100 MCG/2ML IJ SOLN
INTRAMUSCULAR | Status: DC | PRN
  Administered 2023-09-19: 100 ug via INTRAVENOUS

## 2023-09-19 MED ORDER — OXYCODONE HCL 5 MG/5ML PO SOLN: 5.0000 mg | Freq: Once | ORAL | Status: DC | PRN

## 2023-09-19 MED ORDER — DROPERIDOL 2.5 MG/ML IJ SOLN: 0.6250 mg | Freq: Once | INTRAMUSCULAR | Status: DC | PRN

## 2023-09-19 MED ORDER — BUPIVACAINE-EPINEPHRINE (PF) 0.25% -1:200000 IJ SOLN
INTRAMUSCULAR | Status: DC | PRN
  Administered 2023-09-19: 50 mL

## 2023-09-19 MED ORDER — LIDOCAINE HCL (CARDIAC) PF 100 MG/5ML IV SOSY
PREFILLED_SYRINGE | INTRAVENOUS | Status: DC | PRN
  Administered 2023-09-19: 100 mg via INTRAVENOUS

## 2023-09-19 MED ORDER — MIDAZOLAM HCL 5 MG/5ML IJ SOLN
INTRAMUSCULAR | Status: DC | PRN
  Administered 2023-09-19: 2 mg via INTRAVENOUS

## 2023-09-19 MED ORDER — CHLORHEXIDINE GLUCONATE 0.12 % MT SOLN
15.0000 mL | Freq: Once | OROMUCOSAL | Status: AC
  Administered 2023-09-19: 15 mL via OROMUCOSAL

## 2023-09-19 MED ORDER — DEXAMETHASONE SODIUM PHOSPHATE 10 MG/ML IJ SOLN
INTRAMUSCULAR | Status: AC
  Filled 2023-09-19: qty 1

## 2023-09-19 MED ORDER — DEXAMETHASONE SODIUM PHOSPHATE 10 MG/ML IJ SOLN
INTRAMUSCULAR | Status: DC | PRN
  Administered 2023-09-19: 10 mg via INTRAVENOUS

## 2023-09-19 MED ORDER — OXYCODONE HCL 5 MG PO TABS
5.0000 mg | ORAL_TABLET | Freq: Once | ORAL | Status: DC | PRN
Start: 2023-09-19 — End: 2023-09-19

## 2023-09-19 MED ORDER — ONDANSETRON HCL 4 MG/2ML IJ SOLN
INTRAMUSCULAR | Status: DC | PRN
  Administered 2023-09-19: 4 mg via INTRAVENOUS

## 2023-09-19 MED ORDER — PROPOFOL 10 MG/ML IV BOLUS
INTRAVENOUS | Status: AC
  Filled 2023-09-19: qty 20

## 2023-09-19 MED ORDER — SUGAMMADEX SODIUM 200 MG/2ML IV SOLN
INTRAVENOUS | Status: DC | PRN
  Administered 2023-09-19: 200 mg via INTRAVENOUS

## 2023-09-19 MED ORDER — GABAPENTIN 300 MG PO CAPS
300.0000 mg | ORAL_CAPSULE | ORAL | Status: AC
  Administered 2023-09-19: 300 mg via ORAL
  Filled 2023-09-19: qty 1

## 2023-09-19 MED ORDER — ACETAMINOPHEN 500 MG PO TABS
1000.0000 mg | ORAL_TABLET | ORAL | Status: AC
  Administered 2023-09-19: 1000 mg via ORAL
  Filled 2023-09-19: qty 2

## 2023-09-19 MED ORDER — ROCURONIUM BROMIDE 10 MG/ML (PF) SYRINGE
PREFILLED_SYRINGE | INTRAVENOUS | Status: AC
  Filled 2023-09-19: qty 10

## 2023-09-19 MED ORDER — ORAL CARE MOUTH RINSE: 15.0000 mL | Freq: Once | OROMUCOSAL | Status: AC

## 2023-09-19 MED ORDER — ONDANSETRON HCL 4 MG/2ML IJ SOLN
INTRAMUSCULAR | Status: AC
  Filled 2023-09-19: qty 2

## 2023-09-19 MED ORDER — LIDOCAINE HCL (PF) 2 % IJ SOLN
INTRAMUSCULAR | Status: AC
  Filled 2023-09-19: qty 5

## 2023-09-19 MED ORDER — EPHEDRINE SULFATE-NACL 50-0.9 MG/10ML-% IV SOSY
PREFILLED_SYRINGE | INTRAVENOUS | Status: DC | PRN
  Administered 2023-09-19: 5 mg via INTRAVENOUS

## 2023-09-19 MED ORDER — LACTATED RINGERS IV SOLN: INTRAVENOUS | Status: DC | PRN

## 2023-09-19 MED ORDER — PHENYLEPHRINE 80 MCG/ML (10ML) SYRINGE FOR IV PUSH (FOR BLOOD PRESSURE SUPPORT)
PREFILLED_SYRINGE | INTRAVENOUS | Status: DC | PRN
  Administered 2023-09-19: 120 ug via INTRAVENOUS
  Administered 2023-09-19: 160 ug via INTRAVENOUS
  Administered 2023-09-19: 120 ug via INTRAVENOUS
  Administered 2023-09-19: 80 ug via INTRAVENOUS
  Administered 2023-09-19 (×2): 120 ug via INTRAVENOUS

## 2023-09-19 MED ORDER — MIDAZOLAM HCL 2 MG/2ML IJ SOLN
INTRAMUSCULAR | Status: AC
Start: 2023-09-19 — End: ?
  Filled 2023-09-19: qty 2

## 2023-09-19 SURGICAL SUPPLY — 29 items
BAG COUNTER SPONGE SURGICOUNT (BAG) IMPLANT
BENZOIN TINCTURE PRP APPL 2/3 (GAUZE/BANDAGES/DRESSINGS) ×1 IMPLANT
BLADE SURG 15 STRL LF DISP TIS (BLADE) ×1 IMPLANT
CHLORAPREP W/TINT 26 (MISCELLANEOUS) ×1 IMPLANT
COVER SURGICAL LIGHT HANDLE (MISCELLANEOUS) ×1 IMPLANT
DRAIN PENROSE 0.5X18 (DRAIN) ×1 IMPLANT
DRAPE LAPAROSCOPIC ABDOMINAL (DRAPES) ×1 IMPLANT
ELECT REM PT RETURN 15FT ADLT (MISCELLANEOUS) ×1 IMPLANT
GAUZE SPONGE 4X4 12PLY STRL (GAUZE/BANDAGES/DRESSINGS) IMPLANT
GLOVE BIO SURGEON STRL SZ 6 (GLOVE) ×1 IMPLANT
GLOVE INDICATOR 6.5 STRL GRN (GLOVE) ×1 IMPLANT
GOWN STRL REUS W/ TWL LRG LVL3 (GOWN DISPOSABLE) ×1 IMPLANT
KIT BASIN OR (CUSTOM PROCEDURE TRAY) ×1 IMPLANT
KIT TURNOVER KIT A (KITS) IMPLANT
MARKER SKIN DUAL TIP RULER LAB (MISCELLANEOUS) ×1 IMPLANT
MESH ULTRAPRO 3X6 7.6X15CM (Mesh General) IMPLANT
NDL HYPO 22X1.5 SAFETY MO (MISCELLANEOUS) ×1 IMPLANT
NEEDLE HYPO 22X1.5 SAFETY MO (MISCELLANEOUS) ×1
PACK GENERAL/GYN (CUSTOM PROCEDURE TRAY) ×1 IMPLANT
SPIKE FLUID TRANSFER (MISCELLANEOUS) ×1 IMPLANT
STRIP CLOSURE SKIN 1/2X4 (GAUZE/BANDAGES/DRESSINGS) ×1 IMPLANT
SUT ETHIBOND 0 MO6 C/R (SUTURE) ×1 IMPLANT
SUT MNCRL AB 4-0 PS2 18 (SUTURE) ×1 IMPLANT
SUT PDS AB 0 CT1 36 (SUTURE) ×2 IMPLANT
SUT VIC AB 3-0 SH 27XBRD (SUTURE) ×2 IMPLANT
SUT VICRYL 3 0 BR 18 UND (SUTURE) ×1 IMPLANT
SYR CONTROL 10ML LL (SYRINGE) ×1 IMPLANT
TAPE CLOTH SURG 4X10 WHT LF (GAUZE/BANDAGES/DRESSINGS) IMPLANT
TOWEL OR 17X26 10 PK STRL BLUE (TOWEL DISPOSABLE) ×1 IMPLANT

## 2023-09-19 NOTE — Discharge Instructions (Signed)
HERNIA REPAIR: POST OP INSTRUCTIONS   EAT Gradually transition to a high fiber diet with a fiber supplement over the next few weeks after discharge.  Start with a pureed / full liquid diet (see below)  WALK Walk an hour a day (cumulative- not all at once).  Control your pain to do that.    CONTROL PAIN Control pain so that you can walk, sleep, tolerate sneezing/coughing, and go up/down stairs.  HAVE A BOWEL MOVEMENT DAILY Keep your bowels regular to avoid problems.  OK to try a laxative to override constipation.  OK to use an antidiarrheal to slow down diarrhea.  Call if not better after 2 tries  CALL IF YOU HAVE PROBLEMS/CONCERNS Call if you are still struggling despite following these instructions. Call if you have concerns not answered by these instructions  ######################################################################    DIET: Follow a light bland diet & liquids the first 24 hours after arrival home, such as soup, liquids, starches, etc.  Be sure to drink plenty of fluids.  Quickly advance to a usual solid diet within a few days.  Avoid fast food or heavy meals initially as you are more likely to get nauseated or have irregular bowels.    Take your usually prescribed home medications unless otherwise directed.  PAIN CONTROL: Pain is best controlled by a usual combination of three different methods TOGETHER: Ice/Heat Over the counter pain medication Prescription pain medication Most patients will experience some swelling and bruising around the hernia repair including the groin and scrotum.  Ice packs or heating pads (30-60 minutes up to 6 times a day) will help. Use ice for the first few days to help decrease swelling and bruising, then switch to heat to help relax tight/sore spots and speed recovery.  Some people prefer to use ice alone, heat alone, alternating between ice & heat.  Experiment to what works for you.  Swelling and bruising can take several weeks to  resolve.   It is helpful to take an over-the-counter pain medication regularly for the first days: Naproxen (Aleve, etc)  Two 220mg  tabs twice a day OR Ibuprofen (Advil, etc) Three 200mg  tabs four times a day (every meal & bedtime) AND Acetaminophen (Tylenol, etc) 325-650mg  four times a day (every meal & bedtime) A  prescription for pain medication should be given to you upon discharge.  Take your pain medication as prescribed, IF NEEDED.  If you are having problems/concerns with the prescription medicine (does not control pain, nausea, vomiting, rash, itching, etc), please call us [phone] to see if we need to switch you to a different pain medicine that will work better for you and/or control your side effect better. If you need a refill on your pain medication, please contact your pharmacy.  They will contact our office to request authorization. Prescriptions will not be filled after 5 pm or on week-ends.  Avoid getting constipated.  Between the surgery and the pain medications, it is common to experience some constipation.  Increasing fluid intake and taking a fiber supplement (such as Metamucil, Citrucel, FiberCon, MiraLax, etc) 1-2 times a day regularly will usually help prevent this problem from occurring.  A mild laxative (prune juice, Milk of Magnesia, MiraLax, etc) should be taken according to package directions if there are no bowel movements after 48 hours.    Wash / shower every day, starting 2 days after surgery.  You may shower over the steri strips which are waterproof.  No rubbing, scrubbing, lotions or ointments to  incision(s). Do not soak or submerge incision.   Remove your outer bandage 2 days after surgery. Steri strips (white tapes) will peel off after 1-2 weeks.  You may leave the incision open to air.  You may replace a dressing/Band-Aid to cover an incision for comfort if you wish.  Continue to shower over incision(s) after the dressing is off.  ACTIVITIES as tolerated:    You may resume regular (light) daily activities beginning the next day--such as daily self-care, walking, climbing stairs--gradually increasing activities as tolerated.  Control your pain so that you can walk an hour a day.  If you can walk 30 minutes without difficulty, it is safe to try more intense activity such as jogging, treadmill, bicycling, low-impact aerobics, swimming, etc. Refrain from the most intensive and strenuous activity such as sit-ups, heavy lifting, contact sports, etc  Refrain from any heavy lifting or straining until 6 weeks after surgery.   DO NOT PUSH THROUGH PAIN.  Let pain be your guide: If it hurts to do something, don't do it.  Pain is your body warning you to avoid that activity for another week until the pain goes down. You may drive when you are no longer taking prescription pain medication, you can comfortably wear a seatbelt, and you can safely maneuver your car and apply brakes. You may have sexual intercourse when it is comfortable.   FOLLOW UP in our office Please call [facility] at [phone] to set up an appointment to see your surgeon in the office for a follow-up appointment approximately 2-3 weeks after your surgery. Make sure that you call for this appointment the day you arrive home to insure a convenient appointment time.  9.  If you have disability of FMLA / Family leave forms, please bring the forms to the office for processing.  (do not give to your surgeon).  WHEN TO CALL us [phone]: Poor pain control Reactions / problems with new medications (rash/itching, nausea, etc)  Fever over 101.5 F (38.5 C) Inability to urinate Nausea and/or vomiting Worsening swelling or bruising Continued bleeding from incision. Increased pain, redness, or drainage from the incision   The clinic staff is available to answer your questions during regular business hours (8:30am-5pm).  Please don't hesitate to call and ask to speak to one of our nurses for clinical  concerns.   If you have a medical emergency, go to the nearest emergency room or call 911.  A surgeon from [facility] is always on call at the hospitals in [facility], [place] [address], [place], [place]  [address] ?  [address] [address], [place], [place]   [address] MAIN: [phone] ? TOLL FREE: [phone] ? FAX: [phone] [email or web address]

## 2023-09-19 NOTE — Transfer of Care (Signed)
Immediate Anesthesia Transfer of Care Note  Patient: Aijalon Wingett Bertholf  Procedure(s) Performed: OPEN RIGHT HERNIA REPAIR INGUINAL ADULT WITH MESH (Right)  Patient Location: PACU  Anesthesia Type:General  Level of Consciousness: sedated  Airway & Oxygen Therapy: Patient Spontanous Breathing  Post-op Assessment: Report given to RN  Post vital signs: Reviewed and stable  Last Vitals:  Vitals Value Taken Time  BP 135/82 09/19/23 1030  Temp    Pulse 95 09/19/23 1032  Resp 15 09/19/23 1032  SpO2 95 % 09/19/23 1032  Vitals shown include unfiled device data.  Last Pain:  Vitals:   09/19/23 0750  TempSrc:   PainSc: 0-No pain         Complications: No notable events documented.

## 2023-09-19 NOTE — H&P (Signed)
[name] Stone [id number]   Referring Provider:  Dayton Scrape, NP   Subjective   Chief Complaint: FOLLOW UP VISIT (ONG TERM FOLLOW UP - [facility])     History of Present Illness:    Very pleasant other wise healthy 57 year old man who returns with a right inguinal hernia.  He has known about this for several years, but it began bothering him about a year ago which is when I first met him.  He has had increasing size and discomfort from the hernia, and it has begun to protrude with more frequency and severity.  It has remained reducible, but he has had a couple episodes of severe pain and incarceration, most recently when the took about 2 hours before he was able to reduce the hernia.  No obstructive symptoms.  He has not had previous abdominal or hernia surgery.  He has been wearing a hernia belt.  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   Review of Systems: A complete review of systems was obtained from the patient.  I have reviewed this information and discussed as appropriate with the patient.  See HPI as well for other ROS.   Medical History: Past Medical History:  Diagnosis Date   Hypertension     There is no problem list on file for this patient.   Past Surgical History:  Procedure Laterality Date   JOINT REPLACEMENT       No Known Allergies  Current Outpatient Medications on File Prior to Visit  Medication Sig Dispense Refill   terbinafine HCL (LAMISIL) 250 mg tablet Take 250 mg by mouth once daily     No current facility-administered medications on file prior to visit.    History reviewed. No pertinent family history.   Social History   Tobacco Use  Smoking Status Never  Smokeless Tobacco Never     Social History   Socioeconomic History   Marital status: Married  Tobacco Use   Smoking status: Never   Smokeless tobacco: Never  Vaping Use   Vaping status: Never Used  Substance and Sexual Activity   Drug use: Never   Social  Determinants of Health    Received from [facility]   Social Network    Objective:    Vitals:   07/11/23 1021  BP: (!) 176/108  Pulse: 62  Temp: 36.6 C (97.9 F)  Weight: 90.6 kg (199 lb 12.8 oz)  Height: 182.9 cm (6')  PainSc: 0-No pain    Body mass index is 27.1 kg/m.  Gen: A&Ox3, no distress  Unlabored respirations Abdomen soft, nontender, nondistended.  Moderate reducible right inguinal hernia which is tender.  No overlying skin change.  No left inguinal hernia.  Assessment and Plan:  Diagnoses and all orders for this visit:  Non-recurrent unilateral inguinal hernia without obstruction or gangrene  Other orders -     Order     We again discussed the relevant anatomy and mentation for open repair.  We discussed the technique of the procedure.  Discussed risks of bleeding, infection, pain, scarring, injury to structures in the area including nerves, blood vessels, bowel, bladder, risk of chronic pain, hernia recurrence, risk of seroma or hematoma, urinary retention, and risks of general anesthesia including cardiovascular, pulmonary, and thromboembolic complications.  Reviewed the postoperative activity limitations and timeline for recovery.  Questions were welcomed and answered.  Patient is agreeable to open right inguinal hernia repair and wishes to proceed with scheduling.   Venisha Boehning Carlye Grippe, MD

## 2023-09-19 NOTE — Anesthesia Postprocedure Evaluation (Signed)
Anesthesia Post Note  Patient: [name] Stone  Procedure(s) Performed: OPEN RIGHT HERNIA REPAIR INGUINAL ADULT WITH MESH (Right)     Patient location during evaluation: PACU Anesthesia Type: General Level of consciousness: awake and alert Pain management: pain level controlled Vital Signs Assessment: post-procedure vital signs reviewed and stable Respiratory status: spontaneous breathing, nonlabored ventilation and respiratory function stable Cardiovascular status: blood pressure returned to baseline Postop Assessment: no apparent nausea or vomiting Anesthetic complications: no   No notable events documented.  Last Vitals:  Vitals:   09/19/23 1100 09/19/23 1115  BP: (!) 155/93 (!) 155/102  Pulse: 99 85  Resp: 15   Temp:  36.4 C  SpO2: 99% 100%    Last Pain:  Vitals:   09/19/23 1115  TempSrc: Oral  PainSc: 0-No pain                 Shanda Howells

## 2023-09-19 NOTE — Anesthesia Procedure Notes (Signed)
Procedure Name: Intubation Date/Time: 09/19/2023 9:02 AM  Performed by: Randa Evens, CRNAPre-anesthesia Checklist: Patient identified, Emergency Drugs available, Suction available and Patient being monitored Patient Re-evaluated:Patient Re-evaluated prior to induction Oxygen Delivery Method: Circle System Utilized Preoxygenation: Pre-oxygenation with 100% oxygen Induction Type: IV induction Ventilation: Mask ventilation without difficulty Laryngoscope Size: Mac and 4 Grade View: Grade II Tube type: Oral Number of attempts: 1 Airway Equipment and Method: Stylet Placement Confirmation: ETT inserted through vocal cords under direct vision, positive ETCO2 and breath sounds checked- equal and bilateral Secured at: 24 cm Tube secured with: Tape Dental Injury: Teeth and Oropharynx as per pre-operative assessment

## 2023-09-19 NOTE — Op Note (Signed)
Operative Note  LARNCE SWECKER  [id number]  [id number]  09/19/2023   Surgeon: Berna Bue MD FACS   Procedure performed: Open right inguinal hernia repair with mesh   Preop diagnosis:  right inguinal hernia   Post-op diagnosis/intraop findings: indirect inguinal hernia   Specimens: none   EBL: 5cc   Complications: none   Description of procedure: After confirming informed consent, the patient was taken to the operating room and placed supine on operating room table where general anesthesia was initiated, preoperative antibiotics were administered, SCDs applied, and a formal timeout was performed. The groin was clipped, prepped and draped in the usual sterile fashion. An oblique incision was made the just above the inguinal ligament after infiltrating the tissues with local anesthetic (exparel mixed with 0.25% marcaine with epinephrine)  Soft tissues were dissected using electrocautery until the external oblique aponeurosis was encountered. This was divided sharply to expand the external ring. A plane was bluntly developed between the spermatic cord and the external oblique. The ilioinguinal nerve was divided between hemostats and each end ligated with 3-0 vicryl ties. The spermatic cord was then bluntly dissected away from the pubic tubercle and encircled with a Penrose. Inspection of the inguinal anatomy revealed a large indirect hernia. The indirect hernia sac was carefully dissected away from the cord structures and skeletonized to the level of the internal ring, where it was reduced intact into the abdomen.  The inguinal floor was reconstructed suturing the conjoint tendon to the inguinal ligament with interrupted 0 PDS, leaving an internal ring just sufficient for the cord structures. A 3 x 6 piece of ultra Pro mesh was brought onto the field and trimmed to approximate the field. This was sutured to the pubic tubercle fascia, inferior shelving edge and to the internal oblique superiorly  with interrupted 0 ethibonds. The tails of the mesh were wrapped around the spermatic cord, ensuring adequate room for the cord, and sutured to each other with 0 ethibond, and then directed laterally to lie flat beneath the external oblique aponeurosis. Hemostasis was ensured within the wound. The Penrose was removed. The external oblique aponeurosis was reapproximated with a running 3-0 Vicryl to re-create a narrowed external ring. More local was infiltrated around the pubic tubercle and in the plane just below the external oblique. The Scarpa's was reapproximated with interrupted 3-0 Vicryls. The skin was closed with a running subcuticular 4-0 Monocryl. The remainder of the local was injected in the subcutaneous and subcuticular space. The field was then cleaned, benzoin and Steri-Strips and sterile bandage were applied. The patient was then awakened extubated and taken to PACU in stable condition.    All counts were correct at the completion of the case

## 2023-09-22 ENCOUNTER — Encounter (HOSPITAL_COMMUNITY): Payer: Self-pay | Admitting: Surgery

## 2023-09-30 ENCOUNTER — Ambulatory Visit (AMBULATORY_SURGERY_CENTER): Payer: BC Managed Care – PPO

## 2023-09-30 VITALS — Ht 72.0 in | Wt 195.0 lb

## 2023-09-30 DIAGNOSIS — Z1211 Encounter for screening for malignant neoplasm of colon: Secondary | ICD-10-CM

## 2023-09-30 MED ORDER — NA SULFATE-K SULFATE-MG SULF 17.5-3.13-1.6 GM/177ML PO SOLN: 1.0000 | Freq: Once | ORAL | 0 refills | Status: AC

## 2023-09-30 NOTE — Progress Notes (Signed)

## 2023-10-14 ENCOUNTER — Encounter: Payer: Self-pay | Admitting: Gastroenterology

## 2023-10-16 ENCOUNTER — Ambulatory Visit: Payer: 59 | Admitting: Gastroenterology

## 2023-10-16 ENCOUNTER — Encounter: Payer: Self-pay | Admitting: Gastroenterology

## 2023-10-16 VITALS — BP 145/88 | HR 64 | Temp 97.5°F | Resp 11 | Ht 72.0 in | Wt 195.0 lb

## 2023-10-16 DIAGNOSIS — Z1211 Encounter for screening for malignant neoplasm of colon: Secondary | ICD-10-CM | POA: Diagnosis present

## 2023-10-16 DIAGNOSIS — K641 Second degree hemorrhoids: Secondary | ICD-10-CM | POA: Diagnosis not present

## 2023-10-16 MED ORDER — SODIUM CHLORIDE 0.9 % IV SOLN: 500.0000 mL | INTRAVENOUS | Status: DC

## 2023-10-16 NOTE — Progress Notes (Signed)
GASTROENTEROLOGY PROCEDURE H&P NOTE   Primary Care Physician: Diamantina Providence, FNP    Reason for Procedure:  Colon Cancer screening  Plan:    Colonoscopy  Patient is appropriate for endoscopic procedure(s) in the ambulatory ([facility]) setting.  The nature of the procedure, as well as the risks, benefits, and alternatives were carefully and thoroughly reviewed with the patient. Ample time for discussion and questions allowed. The patient understood, was satisfied, and agreed to proceed.     HPI: [name] Stone is a 58 y.o. male who presents for colonoscopy for routine Colon Cancer screening.  No active GI symptoms.  No known family history of colon cancer or related malignancy.  Patient is otherwise without complaints or active issues today.  Past Medical History:  Diagnosis Date   Arthritis    Headache    takes Excedrin Migraine as needed   History of shingles    Hypertension    takes Losartan daily   Joint pain     Past Surgical History:  Procedure Laterality Date   ANKLE SURGERY Right 90's   with plates and screws    INGUINAL HERNIA REPAIR Right 09/19/2023   Procedure: OPEN RIGHT HERNIA REPAIR INGUINAL ADULT WITH MESH;  Surgeon: Berna Bue, MD;  Location: [facility];  Service: General;  Laterality: Right;   TOTAL HIP ARTHROPLASTY Right 06/28/2016   Procedure: TOTAL HIP ARTHROPLASTY ANTERIOR APPROACH;  Surgeon: Jodi Geralds, MD;  Location: [facility];  Service: Orthopedics;  Laterality: Right;    Prior to Admission medications   Medication Sig Start Date End Date Taking? Authorizing Provider  Ascorbic Acid (VITAMIN C) 1000 MG tablet Take 1,000 mg by mouth daily.   Yes [provider]  Cholecalciferol (VITAMIN D3) 50 MCG (2000 UT) capsule Take 2,000 Units by mouth daily.   Yes [provider]  Cyanocobalamin (VITAMIN B-12 PO) Take 1 tablet by mouth daily.   Yes [provider]  losartan (COZAAR) 50 MG tablet Take 50 mg by mouth daily.   Yes  [provider]  montelukast (SINGULAIR) 10 MG tablet Take 10 mg by mouth daily. 04/29/23  Yes [provider]  zinc gluconate 50 MG tablet Take 50 mg by mouth every other day.   Yes [provider]  UBRELVY 100 MG TABS Take 1 tablet by mouth as needed (migranes).    [provider]    Current Outpatient Medications  Medication Sig Dispense Refill   Ascorbic Acid (VITAMIN C) 1000 MG tablet Take 1,000 mg by mouth daily.     Cholecalciferol (VITAMIN D3) 50 MCG (2000 UT) capsule Take 2,000 Units by mouth daily.     Cyanocobalamin (VITAMIN B-12 PO) Take 1 tablet by mouth daily.     losartan (COZAAR) 50 MG tablet Take 50 mg by mouth daily.     montelukast (SINGULAIR) 10 MG tablet Take 10 mg by mouth daily.     zinc gluconate 50 MG tablet Take 50 mg by mouth every other day.     UBRELVY 100 MG TABS Take 1 tablet by mouth as needed (migranes).     Current Facility-Administered Medications  Medication Dose Route Frequency Provider Last Rate Last Admin   0.9 %  sodium chloride infusion  500 mL Intravenous Continuous Brianca Fortenberry V, DO        Allergies as of 10/16/2023 - Review Complete 10/16/2023  Allergen Reaction Noted   Tramadol Other (See Comments) 03/21/2017    Family History  Problem Relation Age of  Onset   Colon cancer Neg Hx    Colon polyps Neg Hx    Esophageal cancer Neg Hx    Rectal cancer Neg Hx    Stomach cancer Neg Hx     Social History   Socioeconomic History   Marital status: Married    Spouse name: Not on file   Number of children: Not on file   Years of education: Not on file   Highest education level: Not on file  Occupational History   Not on file  Tobacco Use   Smoking status: Former    Types: Cigars   Smokeless tobacco: Never   Tobacco comments:    once every 6 months  Vaping Use   Vaping status: Never Used  Substance and Sexual Activity   Alcohol use: Yes    Comment: rarely   Drug use: No   Sexual activity:  Yes  Other Topics Concern   Not on file  Social History Narrative   Not on file   Social Drivers of Health   Financial Resource Strain: Not on file  Food Insecurity: Not on file  Transportation Needs: Not on file  Physical Activity: Not on file  Stress: Not on file  Social Connections: Unknown (02/11/2022)   Received from [facility], [facility]   Social Network    Social Network: Not on file  Intimate Partner Violence: Unknown (01/03/2022)   Received from [facility], [facility]   HITS    Physically Hurt: Not on file    Insult or Talk Down To: Not on file    Threaten Physical Harm: Not on file    Scream or Curse: Not on file    Physical Exam: Vital signs in last 24 hours: @BP  (!) 150/86   Pulse 68   Temp (!) 97.5 F (36.4 C)   Ht 6' (1.829 m)   Wt 195 lb (88.5 kg)   SpO2 100%   BMI 26.45 kg/m  GEN: NAD EYE: Sclerae anicteric ENT: MMM CV: Non-tachycardic Pulm: CTA b/l GI: Soft, NT/ND NEURO:  Alert & Oriented x 3   Doristine Locks, DO [facility]   10/16/2023 9:07 AM

## 2023-10-16 NOTE — Op Note (Signed)
[facility] Patient Name: [name] Stone Procedure Date: 10/16/2023 9:03 AM MRN: [id number] Endoscopist: Doristine Locks , MD, [id number] Age: 58 Referring MD:  Date of Birth: December 20, 1965 Gender: Male Account #: [id number] Procedure:                Colonoscopy Indications:              Screening for colorectal malignant neoplasm, This                            is the patient's first colonoscopy Medicines:                Monitored Anesthesia Care Procedure:                Pre-Anesthesia Assessment:                           - Prior to the procedure, a History and Physical                            was performed, and patient medications and                            allergies were reviewed. The patient's tolerance of                            previous anesthesia was also reviewed. The risks                            and benefits of the procedure and the sedation                            options and risks were discussed with the patient.                            All questions were answered, and informed consent                            was obtained. Prior Anticoagulants: The patient has                            taken no anticoagulant or antiplatelet agents. ASA                            Grade Assessment: II - A patient with mild systemic                            disease. After reviewing the risks and benefits,                            the patient was deemed in satisfactory condition to                            undergo the procedure.  After obtaining informed consent, the colonoscope                            was passed under direct vision. Throughout the                            procedure, the patient's blood pressure, pulse, and                            oxygen saturations were monitored continuously. The                            Olympus Scope SN: [id number] was introduced through                            the anus and advanced to  the the terminal ileum.                            The colonoscopy was performed without difficulty.                            The patient tolerated the procedure well. The                            quality of the bowel preparation was good. The                            terminal ileum, ileocecal valve, appendiceal                            orifice, and rectum were photographed. Scope In: 9:17:29 AM Scope Out: 9:33:49 AM Scope Withdrawal Time: 0 hours 12 minutes 30 seconds  Total Procedure Duration: 0 hours 16 minutes 20 seconds  Findings:                 The perianal and digital rectal examinations were                            normal.                           The entire colon appeared normal.                           Retroflexion in the right colon was performed.                           Non-bleeding internal hemorrhoids were found during                            retroflexion. The hemorrhoids were small and Grade                            II (internal hemorrhoids that prolapse but reduce  spontaneously).                           The terminal ileum appeared normal. Complications:            No immediate complications. Estimated Blood Loss:     Estimated blood loss: none. Impression:               - The entire examined colon is normal.                           - Non-bleeding internal hemorrhoids.                           - The examined portion of the ileum was normal.                           - No specimens collected. Recommendation:           - Patient has a contact number available for                            emergencies. The signs and symptoms of potential                            delayed complications were discussed with the                            patient. Return to normal activities tomorrow.                            Written discharge instructions were provided to the                            patient.                            - Resume previous diet.                           - Continue present medications.                           - Repeat colonoscopy in 10 years for screening                            purposes.                           - Return to GI office PRN. Doristine Locks, MD 10/16/2023 9:38:51 AM

## 2023-10-16 NOTE — Patient Instructions (Signed)
Read all of the handouts given to you by your recovery room nurse. Resume all of your previous medications today as ordered.  YOU HAD AN ENDOSCOPIC PROCEDURE TODAY AT THE [facility]:   Refer to the procedure report that was given to you for any specific questions about what was found during the examination.  If the procedure report does not answer your questions, please call your gastroenterologist to clarify.  If you requested that your care partner not be given the details of your procedure findings, then the procedure report has been included in a sealed envelope for you to review at your convenience later.  YOU SHOULD EXPECT: Some feelings of bloating in the abdomen. Passage of more gas than usual.  Walking can help get rid of the air that was put into your GI tract during the procedure and reduce the bloating. If you had a lower endoscopy (such as a colonoscopy or flexible sigmoidoscopy) you may notice spotting of blood in your stool or on the toilet paper. If you underwent a bowel prep for your procedure, you may not have a normal bowel movement for a few days.  Please Note:  You might notice some irritation and congestion in your nose or some drainage.  This is from the oxygen used during your procedure.  There is no need for concern and it should clear up in a day or so.  SYMPTOMS TO REPORT IMMEDIATELY:  Following lower endoscopy (colonoscopy or flexible sigmoidoscopy):  Excessive amounts of blood in the stool  Significant tenderness or worsening of abdominal pains  Swelling of the abdomen that is new, acute  Fever of 100F or higher  For urgent or emergent issues, a gastroenterologist can be reached at any hour by calling [phone]. Do not use MyChart messaging for urgent concerns.    DIET:  We do recommend a small meal at first, but then you may proceed to your regular diet.  Drink plenty of fluids but you should avoid alcoholic beverages for 24 hours.  ACTIVITY:   You should plan to take it easy for the rest of today and you should NOT DRIVE or use heavy machinery until tomorrow (because of the sedation medicines used during the test).    FOLLOW UP: Our staff will call the number listed on your records the next business day following your procedure.  We will call around 7:15- 8:00 am to check on you and address any questions or concerns that you may have regarding the information given to you following your procedure. If we do not reach you, we will leave a message.       SIGNATURES/CONFIDENTIALITY: You and/or your care partner have signed paperwork which will be entered into your electronic medical record.  These signatures attest to the fact that that the information above on your After Visit Summary has been reviewed and is understood.  Full responsibility of the confidentiality of this discharge information lies with you and/or your care-partner.

## 2023-10-17 ENCOUNTER — Telehealth: Payer: Self-pay | Admitting: *Deleted

## 2023-10-17 NOTE — Telephone Encounter (Signed)
  Follow up Call-     10/16/2023    8:12 AM  Call back number  Post procedure Call Back phone  # [phone]  Permission to leave phone message Yes     Patient questions:   Message left to call us if necessary.
